# Patient Record
Sex: Male | Born: 1950
Health system: Southern US, Community
[De-identification: ages and names within clinical notes are randomized; demographics above are authoritative.]

## PROBLEM LIST (undated history)

## (undated) DIAGNOSIS — E785 Hyperlipidemia, unspecified: Secondary | ICD-10-CM

## (undated) DIAGNOSIS — Z720 Tobacco use: Secondary | ICD-10-CM

## (undated) DIAGNOSIS — R55 Syncope and collapse: Secondary | ICD-10-CM

## (undated) DIAGNOSIS — I340 Nonrheumatic mitral (valve) insufficiency: Secondary | ICD-10-CM

## (undated) DIAGNOSIS — E119 Type 2 diabetes mellitus without complications: Secondary | ICD-10-CM

## (undated) HISTORY — PX: URETERAL STENT PLACEMENT: SHX822

## (undated) HISTORY — DX: Nonrheumatic mitral (valve) insufficiency: I34.0

## (undated) HISTORY — DX: Syncope and collapse: R55

---

## 2017-02-27 ENCOUNTER — Telehealth: Payer: Self-pay | Admitting: Podiatry

## 2017-02-27 NOTE — Telephone Encounter (Signed)
Pt called wanting to speak to you about appointment made for next Wednesday.

## 2017-03-06 ENCOUNTER — Ambulatory Visit (INDEPENDENT_AMBULATORY_CARE_PROVIDER_SITE_OTHER): Payer: Managed Care, Other (non HMO) | Admitting: Podiatry

## 2017-03-06 ENCOUNTER — Encounter: Payer: Self-pay | Admitting: Podiatry

## 2017-03-06 ENCOUNTER — Ambulatory Visit (INDEPENDENT_AMBULATORY_CARE_PROVIDER_SITE_OTHER): Payer: Managed Care, Other (non HMO)

## 2017-03-06 VITALS — BP 141/91 | HR 75 | Temp 97.6°F | Resp 18

## 2017-03-06 DIAGNOSIS — M79673 Pain in unspecified foot: Secondary | ICD-10-CM | POA: Diagnosis not present

## 2017-03-06 DIAGNOSIS — S92011A Displaced fracture of body of right calcaneus, initial encounter for closed fracture: Secondary | ICD-10-CM | POA: Diagnosis not present

## 2017-03-06 NOTE — Progress Notes (Signed)
   Subjective:    Patient ID: Joseph Robles, male    DOB: Feb 24, 1951, 66 y.o.   MRN: [id number]  HPI: He presents today with a chief complaint of   fracture to his right heel. He states that June 24 he was on a ladder that was falling and he jumped from the ladder landing on his heel. States that he walked around for 2 or 3 days with no pain but noticed a lot of swelling. He was seen in the emergency department was told that he had a fractured heel and a CT scan was performed. CT scan does relate to fracture lines including the sustentaculum tali and middle facet of the subtalar joint and the posterior facet as well. He states that he's been using crutches as well as a splint that they placed him in at that time. He has not been weightbearing. He has taken some of the Ace bandage off from time to time. He states his diabetes is under control states that his hemoglobin A1c is 7.2 denies neuropathy in any extremity but states that he has no pain with this foot whatsoever.   Review of Systems     Objective:   Physical Exam: Vital signs are stable he is alert and oriented 3. Pulses are palpable bilateral. Diminished sensorium to the right foot. Left foot normal sensorium present per Semmes-Weinstein monofilament deep tendon reflexes are not elicitable. Muscle strength relatively normal bilateral. Orthopedic evaluation demonstrates edematous right lower extremity no open lesions or wounds are noted. No pain on mediolateral compression of the calcaneus. Radiographs taken today do demonstrate osseously mature individual with a large fracture of the posterior inferior aspect of the calcaneus. The CT report does demonstrate 2 or 3 fracture lines. I would actually like to see the CT myself. We are requesting the disc.       Assessment & Plan:  Fractural calcaneus displaced 6 weeks right foot.  Plan: At this point I demonstrated to him and his wife how to compress the fluid from the right lower extremity  with 3 Ace bandages. The understand this and amenable to it will do this. Also place him in a Cam Walker nonweightbearing fashion. He is to follow up with us in a couple weeks which time we hope to have the CT scans back. We will discuss the need for surgical intervention if necessary.

## 2017-03-20 ENCOUNTER — Ambulatory Visit (INDEPENDENT_AMBULATORY_CARE_PROVIDER_SITE_OTHER): Payer: Managed Care, Other (non HMO) | Admitting: Sports Medicine

## 2017-03-20 DIAGNOSIS — M79672 Pain in left foot: Secondary | ICD-10-CM

## 2017-03-20 DIAGNOSIS — S92011D Displaced fracture of body of right calcaneus, subsequent encounter for fracture with routine healing: Secondary | ICD-10-CM

## 2017-03-20 DIAGNOSIS — R600 Localized edema: Secondary | ICD-10-CM

## 2017-03-20 DIAGNOSIS — E119 Type 2 diabetes mellitus without complications: Secondary | ICD-10-CM | POA: Diagnosis not present

## 2017-03-20 DIAGNOSIS — M79671 Pain in right foot: Secondary | ICD-10-CM

## 2017-03-20 NOTE — Progress Notes (Signed)
Subjective: Joseph Robles is a 66 y.o. male patient who presents to office for evaluation of Right heel fracture. Patient admits to injury on June 24 thought he sprained it badly after falling from ladder; finally had CT done on 02-21-17 which confirmed fracture and since has been non-weightbearing and was referred from [facility]. He saw Dr. Al CorpusHyatt here at [facility], 2 weeks ago and was given CAM boot and instructions on wrapping with ACE to reduce edema. Patient denies any other pedal complaints.   There are no active problems to display for this patient.   Current Outpatient Prescriptions on File Prior to Visit  Medication Sig Dispense Refill  . metFORMIN (GLUCOPHAGE) 500 MG tablet      No current facility-administered medications on file prior to visit.     Allergies  Allergen Reactions  . Neosporin [Neomycin-Bacitracin Zn-Polymyx] Rash    Objective:  General: Alert and oriented x3 in no acute distress  Dermatology: + petichae and likely irritation from ACE too tight on right, No open lesions bilateral lower extremities, no webspace macerations, no ecchymosis bilateral, all nails x 10 are well manicured.  Vascular: Focal edema noted to right foot and lower leg, Dorsalis Pedis and Posterior Tibial pedal pulses 2/4, Capillary Fill Time 3 seconds,(+) pedal hair growth bilateral, Temperature gradient within normal limits.  Neurology: Gross sensation intact via light touch bilateral, tuning fork diminished on right however protective intact.  Musculoskeletal: There is no tenderness with palpation at heel on Right foot, There is not pain present with tuning fork to right heel,No pain with calf compression bilateral. All joint range of motion is within normal limits except at area of concern/fracture on right heel, Strength within normal limits in all groups bilateral.   CT scan 02-21-17 communited fracture right heel at calcanueus with attenuation AFT ligament. No  tendon entrapment.     Assessment and Plan: Problem List Items Addressed This Visit    None    Visit Diagnoses    Closed displaced fracture of body of right calcaneus with routine healing, subsequent encounter    -  Primary   Edema of right foot       Right foot pain       Diabetes mellitus without complication (HCC)           -Complete examination performed -CT scans reviewed  -Discussed treatement options for fracture; risks, alternatives, and benefits explained. The fracture is almost 2 months old since date of injury and at this point the fracture has already started to heal by concept of natural biologics; No surgery at this time will continue with conservative care -Applied surgitube compression sleeve to wear on right to assist with edema reduction  -Continue with crutches and non weight bearing with CAM boot protection to wear at all times when up and moving around with crutches  -Recommend protection, rest, ice, elevation daily until symptoms improve -Recommend vit d, calcium and anti-inflammatories prn to help bone healing and with swelling  -Short term disability paperwork completed hon patient 's behalf with estimated time off work to all fracture to heal of 3 months (06-20-17) -Patient to return to office in 3 weeks for serial x-rays of right heel to assess healing or sooner if condition worsens.  Asencion Islamitorya Litsy Epting, DPM

## 2017-04-10 ENCOUNTER — Encounter (INDEPENDENT_AMBULATORY_CARE_PROVIDER_SITE_OTHER): Payer: Self-pay

## 2017-04-10 ENCOUNTER — Ambulatory Visit (INDEPENDENT_AMBULATORY_CARE_PROVIDER_SITE_OTHER): Payer: 59 | Admitting: Sports Medicine

## 2017-04-10 ENCOUNTER — Ambulatory Visit (INDEPENDENT_AMBULATORY_CARE_PROVIDER_SITE_OTHER): Payer: 59

## 2017-04-10 DIAGNOSIS — M79671 Pain in right foot: Secondary | ICD-10-CM

## 2017-04-10 DIAGNOSIS — S92011D Displaced fracture of body of right calcaneus, subsequent encounter for fracture with routine healing: Secondary | ICD-10-CM

## 2017-04-10 DIAGNOSIS — E119 Type 2 diabetes mellitus without complications: Secondary | ICD-10-CM

## 2017-04-10 DIAGNOSIS — R600 Localized edema: Secondary | ICD-10-CM

## 2017-04-10 NOTE — Progress Notes (Signed)
Subjective: Joseph Robles is a 66 y.o. male patient who presents to office for evaluation of Right heel fracture. Patient had injury on June 24 and has been NWB with crutches and CAM boot and wrapping with ACE to reduce edema. Patient denies pain. Denies any other pedal complaints.   There are no active problems to display for this patient.   Current Outpatient Prescriptions on File Prior to Visit  Medication Sig Dispense Refill  . metFORMIN (GLUCOPHAGE) 500 MG tablet      No current facility-administered medications on file prior to visit.     Allergies  Allergen Reactions  . Neosporin [Neomycin-Bacitracin Zn-Polymyx] Rash    Objective:  General: Alert and oriented x3 in no acute distress  Dermatology:  No open lesions bilateral lower extremities, no webspace macerations, no ecchymosis bilateral, all nails x 10 are well manicured.  Vascular: Focal edema noted to right foot and lower leg, Dorsalis Pedis and Posterior Tibial pedal pulses 2/4, Capillary Fill Time 3 seconds,(+) pedal hair growth bilateral, Temperature gradient within normal limits.  Neurology: Gross sensation intact via light touch bilateral, tuning fork diminished on right however protective intact.  Musculoskeletal: There is no tenderness with palpation at heel on Right foot, There is not pain present with tuning fork to right heel,No pain with calf compression bilateral. All joint range of motion is within normal limits except at area of concern/fracture on right heel, Strength within normal limits in all groups bilateral.   Xray right foot- healing calcaneal fracture about 85% improved with still evidence of 1mm fracture at inferior fracture line, with acceptable alignment and retained height. There is posterior heel spur and mild midfoot arthritis. Mild soft tissue swelling. No other acute findings.   Assessment and Plan: Problem List Items Addressed This Visit    None    Visit Diagnoses    Closed displaced  fracture of body of right calcaneus with routine healing, subsequent encounter    -  Primary   Relevant Orders   DG Foot Complete Right (Completed)   Edema of right foot       Right foot pain       Diabetes mellitus without complication (HCC)           -Complete examination performed -Xrays reviewed  -Discussed continue care for calcaneal fracture -Continue with surgitube compression sleeve to wear on right to assist with edema reduction  -Continue with crutches and non weight bearing with CAM boot protection to wear at all times when up and moving around with crutches  -Recommend protection, rest, ice, elevation daily until symptoms improve -Recommend continue with vit d, calcium and anti-inflammatories prn to help bone healing and with swelling  -Continue with Short term disability estimated return 06-20-17 -At next visit if continuing to do well will progress patient to weightbearing with the CAM Boot. Advised patient that likely he will have to do home PT.  -Patient to return to office in 4 weeks for serial x-rays of right heel to assess healing or sooner if condition worsens.  Asencion Islamitorya Kirk Basquez, DPM

## 2017-04-17 ENCOUNTER — Telehealth: Payer: Self-pay | Admitting: Sports Medicine

## 2017-04-17 NOTE — Telephone Encounter (Signed)
Pt's wife called wanting to know if Dr. Marylene LandStover can put on the claims form and/or letter head that pt also has/had a dislocated ankle as well as a fractured ankle. Insurance will pay us $400 extra if it states his ankle was also dislocated. I would prefer to speak to Dr. Marylene LandStover directly in regards to this matter. She can call me back at [phone].

## 2017-05-08 ENCOUNTER — Ambulatory Visit (INDEPENDENT_AMBULATORY_CARE_PROVIDER_SITE_OTHER): Payer: BLUE CROSS/BLUE SHIELD | Admitting: Sports Medicine

## 2017-05-08 ENCOUNTER — Ambulatory Visit (INDEPENDENT_AMBULATORY_CARE_PROVIDER_SITE_OTHER): Payer: BLUE CROSS/BLUE SHIELD

## 2017-05-08 DIAGNOSIS — S92011D Displaced fracture of body of right calcaneus, subsequent encounter for fracture with routine healing: Secondary | ICD-10-CM

## 2017-05-08 DIAGNOSIS — B359 Dermatophytosis, unspecified: Secondary | ICD-10-CM | POA: Diagnosis not present

## 2017-05-08 DIAGNOSIS — E119 Type 2 diabetes mellitus without complications: Secondary | ICD-10-CM | POA: Diagnosis not present

## 2017-05-08 DIAGNOSIS — M79671 Pain in right foot: Secondary | ICD-10-CM

## 2017-05-08 DIAGNOSIS — R600 Localized edema: Secondary | ICD-10-CM | POA: Diagnosis not present

## 2017-05-08 MED ORDER — CLOTRIMAZOLE 1 % EX SOLN: 1.0000 "application " | Freq: Two times a day (BID) | CUTANEOUS | 5 refills | Status: DC

## 2017-05-08 MED ORDER — MICONAZOLE NITRATE 2 % EX AERO: INHALATION_SPRAY | CUTANEOUS | 1 refills | Status: DC

## 2017-05-08 NOTE — Progress Notes (Signed)
Subjective: Joseph Robles is a 66 y.o. male patient who presents to office for evaluation of Right heel fracture. Patient had injury on June 24 and has been NWB with crutches and CAM boot and wrapping with ACE to reduce edema. Patient denies pain. Denies any other pedal complaints.   There are no active problems to display for this patient.   Current Outpatient Prescriptions on File Prior to Visit  Medication Sig Dispense Refill  . metFORMIN (GLUCOPHAGE) 500 MG tablet      No current facility-administered medications on file prior to visit.     Allergies  Allergen Reactions  . Neosporin [Neomycin-Bacitracin Zn-Polymyx] Rash    Objective:  General: Alert and oriented x3 in no acute distress  Dermatology:  No open lesions bilateral lower extremities, no webspace macerations, no ecchymosis bilateral, all nails x 10 are well manicured. Scaly interdigital patches consistent with tinea at heel and in between toes.   Vascular: Focal edema noted to right foot and lower leg, Dorsalis Pedis and Posterior Tibial pedal pulses 2/4, Capillary Fill Time 3 seconds,(+) pedal hair growth bilateral, Temperature gradient within normal limits.  Neurology: Gross sensation intact via light touch bilateral, tuning fork diminished on right however protective intact.  Musculoskeletal: There is no tenderness with palpation at heel on Right foot, There is not pain present with tuning fork to right heel,No pain with calf compression bilateral. All joint range of motion is within normal limits except at area of concern/fracture on right heel, Strength within normal limits in all groups bilateral.   Xray right foot- healing calcaneal fracture about 85% improved with still evidence of 1mm fracture at inferior fracture line, with acceptable alignment and retained height, as prior. There is posterior heel spur and mild midfoot arthritis. Mild soft tissue swelling. No other acute findings.   Assessment and Plan: Problem  List Items Addressed This Visit    None    Visit Diagnoses    Closed displaced fracture of body of right calcaneus with routine healing, subsequent encounter    -  Primary   Relevant Orders   DG Foot Complete Right   Edema of right foot       Right foot pain       Diabetes mellitus without complication (HCC)       Tinea       Relevant Medications   clotrimazole (LOTRIMIN) 1 % external solution   Miconazole Nitrate 2 % AERO      -Complete examination performed -Prescribed clotrimazole solution and miconazole spray for tinea, right foot -Xrays reviewed  -Discussed continue care for calcaneal fracture -Continue with surgitube compression sleeve to wear on right to assist with edema reduction  -Continue with crutches and Now partial weight bearing to forefoot with CAM boot protection to wear at all times when up and moving around with crutches  -Recommend protection, rest, ice, elevation daily until symptoms improve -Recommend continue with vit d, calcium and anti-inflammatories prn to help bone healing and with swelling  -Continue with Short term disability estimated return 06-20-17 however, this return to work date will be reassessed at next visit -At next visit if continuing to do well will progress patient to weightbearing with the CAM Boot. Advised patient that likely he will have to do home PT. may now start with home exercises that are nonweightbearing with range of motion to the foot and ankle. -Patient to return to office in 4-5 weeks for serial x-rays of right heel to assess healing or sooner if  condition worsens.  Landis Martins, DPM

## 2017-06-12 ENCOUNTER — Encounter: Payer: Self-pay | Admitting: Sports Medicine

## 2017-06-12 ENCOUNTER — Ambulatory Visit: Payer: BLUE CROSS/BLUE SHIELD | Admitting: Sports Medicine

## 2017-06-12 ENCOUNTER — Ambulatory Visit (INDEPENDENT_AMBULATORY_CARE_PROVIDER_SITE_OTHER): Payer: BLUE CROSS/BLUE SHIELD

## 2017-06-12 DIAGNOSIS — B359 Dermatophytosis, unspecified: Secondary | ICD-10-CM | POA: Diagnosis not present

## 2017-06-12 DIAGNOSIS — S92011D Displaced fracture of body of right calcaneus, subsequent encounter for fracture with routine healing: Secondary | ICD-10-CM

## 2017-06-12 DIAGNOSIS — E119 Type 2 diabetes mellitus without complications: Secondary | ICD-10-CM | POA: Diagnosis not present

## 2017-06-12 DIAGNOSIS — M79671 Pain in right foot: Secondary | ICD-10-CM

## 2017-06-12 DIAGNOSIS — R600 Localized edema: Secondary | ICD-10-CM

## 2017-06-12 NOTE — Progress Notes (Addendum)
Subjective: Joseph Robles is a 66 y.o. male patient who presents to office for evaluation of Right heel fracture. Patient had injury on June 24 and has been NWB with crutches and CAM boot and wrapping with ACE to reduce edema. Patient denies pain, states that its slowly getting better. Denies any other pedal complaints.   There are no active problems to display for this patient.   Current Outpatient Medications on File Prior to Visit  Medication Sig Dispense Refill  . clotrimazole (LOTRIMIN) 1 % external solution Apply 1 application topically 2 (two) times daily. In between toes 60 mL 5  . metFORMIN (GLUCOPHAGE) 500 MG tablet     . Miconazole Nitrate 2 % AERO To foot for tinea daily 150 Bottle 1   No current facility-administered medications on file prior to visit.     Allergies  Allergen Reactions  . Neosporin [Neomycin-Bacitracin Zn-Polymyx] Rash    Objective:  General: Alert and oriented x3 in no acute distress  Dermatology:  No open lesions bilateral lower extremities, no webspace macerations, no ecchymosis bilateral, all nails x 10 are well manicured. Scaly interdigital patches consistent with tinea at heel and in between toes.   Vascular: Focal edema noted to right foot and lower leg, Dorsalis Pedis and Posterior Tibial pedal pulses 2/4, Capillary Fill Time 3 seconds,(+) pedal hair growth bilateral, Temperature gradient within normal limits.  Neurology: Gross sensation intact via light touch bilateral, tuning fork diminished on right however protective intact.  Musculoskeletal: There is no tenderness with palpation at heel on Right foot, There is not pain present with tuning fork to right heel,No pain with calf compression bilateral. All joint range of motion is within normal limits except at area of concern/fracture on right heel, Strength within normal limits in all groups bilateral.   Xray right foot- healing calcaneal fracture about 85% improved with still evidence of 1mm  fracture at inferior fracture line, with acceptable alignment and retained height, as prior. There is posterior heel spur and mild midfoot arthritis. Mild soft tissue swelling. No other acute findings.   Assessment and Plan: Problem List Items Addressed This Visit    None    Visit Diagnoses    Closed displaced fracture of body of right calcaneus with routine healing, subsequent encounter    -  Primary   Relevant Orders   DG Foot Complete Right   Edema of right foot       Right foot pain       Diabetes mellitus without complication (HCC)       Tinea          -Complete examination performed -Xrays reviewed  -Discussed continue care for prematurely healed calcaneal fracture -Continue with surgitube compression sleeve to wear on right to assist with edema reduction  -May start to slowly wean from crutches and fully weight-bear with good supportive shoes  -Recommend protection, rest, ice, elevation daily until symptoms improve -Recommend continue with vit d, calcium and anti-inflammatories prn to help continue with bone healing and with swelling  -Continue with Short term disability estimated return 08-19-17 full duty no restrictions; Paperwork completed in office   -At next visit if continuing to do well will progress to normal activities. May now start with home exercises that are nonweightbearing with range of motion to the foot and ankle. -Continue with antifungal treatments for tinea, right foot -Patient to return to office in 4-5 weeks for serial x-rays of right heel to assess healing or sooner if condition worsens.  Landis Martins, DPM

## 2017-07-11 ENCOUNTER — Ambulatory Visit: Payer: BLUE CROSS/BLUE SHIELD | Admitting: Sports Medicine

## 2017-07-11 ENCOUNTER — Ambulatory Visit: Payer: BLUE CROSS/BLUE SHIELD

## 2017-07-11 ENCOUNTER — Encounter: Payer: Self-pay | Admitting: Sports Medicine

## 2017-07-11 DIAGNOSIS — S92011D Displaced fracture of body of right calcaneus, subsequent encounter for fracture with routine healing: Secondary | ICD-10-CM

## 2017-07-11 DIAGNOSIS — E119 Type 2 diabetes mellitus without complications: Secondary | ICD-10-CM | POA: Diagnosis not present

## 2017-07-11 DIAGNOSIS — M79671 Pain in right foot: Secondary | ICD-10-CM

## 2017-07-11 DIAGNOSIS — R600 Localized edema: Secondary | ICD-10-CM

## 2017-07-11 NOTE — Progress Notes (Signed)
Subjective: Joseph Robles is a 66 y.o. male patient who presents to office for evaluation of Right heel fracture. Patient had injury on June 24 and has slowly transition back to good supportive shoes/hiking boot and wrapping with ACE to reduce edema. Patient admits after being on his foot for several hours throughout the day has swelling and has a limp. Denies any other pedal complaints.   There are no active problems to display for this patient.   Current Outpatient Medications on File Prior to Visit  Medication Sig Dispense Refill  . clotrimazole (LOTRIMIN) 1 % external solution Apply 1 application topically 2 (two) times daily. In between toes 60 mL 5  . metFORMIN (GLUCOPHAGE) 500 MG tablet     . Miconazole Nitrate 2 % AERO To foot for tinea daily 150 Bottle 1   No current facility-administered medications on file prior to visit.     Allergies  Allergen Reactions  . Neosporin [Neomycin-Bacitracin Zn-Polymyx] Rash    Objective:  General: Alert and oriented x3 in no acute distress  Dermatology:  No open lesions bilateral lower extremities, no webspace macerations, no ecchymosis bilateral, all nails x 10 are well manicured. Scaly interdigital patches consistent with tinea at heel and in between toes per patient that is improving with home remedy.   Vascular: Focal edema noted to right foot and lower leg, Dorsalis Pedis and Posterior Tibial pedal pulses 2/4, Capillary Fill Time 3 seconds,(+) pedal hair growth bilateral, Temperature gradient within normal limits.  Neurology: Gross sensation intact via light touch bilateral, tuning fork diminished on right however protective intact.  Musculoskeletal: There is no tenderness with palpation at heel on Right foot, No pain with calf compression bilateral. All joint range of motion is within normal limits except at area of concern/fracture on right heel when there is mild guarding, Strength within normal limits in all groups bilateral.   Patient  refused repeat x-ray at this visit.  Assessment and Plan: Problem List Items Addressed This Visit    None    Visit Diagnoses    Closed displaced fracture of body of right calcaneus with routine healing, subsequent encounter    -  Primary   Edema of right foot       Right foot pain       Diabetes mellitus without complication (HCC)          -Complete examination performed -Patient refused repeat x-ray due to financial charges -Discussed continue care for previously prematurely healed calcaneal fracture -Recommend patient to go to elastic therapy for compression garments to assist with edema control -May  may continue to weight-bear with good supportive shoes  -Recommend to pace self with activities and rest as needed, ice, elevation daily until symptoms improve -Recommend continue with vit d, calcium and anti-inflammatories prn to help continue with bone healing and with swelling  -Continue with Short term disability estimated return 08-19-17 full duty no restrictions; will reassess return to work at next visit -Continue with antifungal treatments/home remedies for tinea, right foot -Patient to return to office in 4-5 weeks for serial x-rays of right heel to assess healing or sooner if condition worsens.  Asencion Islamitorya Sharyn Brilliant, DPM

## 2017-08-14 ENCOUNTER — Ambulatory Visit (INDEPENDENT_AMBULATORY_CARE_PROVIDER_SITE_OTHER): Payer: BLUE CROSS/BLUE SHIELD

## 2017-08-14 ENCOUNTER — Ambulatory Visit: Payer: BLUE CROSS/BLUE SHIELD | Admitting: Sports Medicine

## 2017-08-14 ENCOUNTER — Encounter: Payer: Self-pay | Admitting: Sports Medicine

## 2017-08-14 DIAGNOSIS — M79671 Pain in right foot: Secondary | ICD-10-CM

## 2017-08-14 DIAGNOSIS — R600 Localized edema: Secondary | ICD-10-CM | POA: Diagnosis not present

## 2017-08-14 DIAGNOSIS — B359 Dermatophytosis, unspecified: Secondary | ICD-10-CM

## 2017-08-14 DIAGNOSIS — S92011D Displaced fracture of body of right calcaneus, subsequent encounter for fracture with routine healing: Secondary | ICD-10-CM

## 2017-08-14 DIAGNOSIS — E119 Type 2 diabetes mellitus without complications: Secondary | ICD-10-CM | POA: Diagnosis not present

## 2017-08-14 NOTE — Progress Notes (Signed)
Subjective: Joseph Robles is a 67 y.o. male patient who presents to office for evaluation of Right heel fracture. Patient had injury on June 24 and has slowly transition back to good supportive shoes/hiking boot and wrapping with ACE to reduce edema PRN. Patient admits discomfort and stiffness that sometimes leads to pain after a few hours. Can not do much before he has pain. Marland Kitchen. Has difficulty even with simple tasks like going to the grocery store.  Denies any other pedal complaints.   There are no active problems to display for this patient.   Current Outpatient Medications on File Prior to Visit  Medication Sig Dispense Refill  . clotrimazole (LOTRIMIN) 1 % external solution Apply 1 application topically 2 (two) times daily. In between toes 60 mL 5  . metFORMIN (GLUCOPHAGE) 500 MG tablet     . Miconazole Nitrate 2 % AERO To foot for tinea daily 150 Bottle 1   No current facility-administered medications on file prior to visit.     Allergies  Allergen Reactions  . Neosporin [Neomycin-Bacitracin Zn-Polymyx] Rash    Objective:  General: Alert and oriented x3 in no acute distress  Dermatology:  No open lesions bilateral lower extremities, no webspace macerations, no ecchymosis bilateral, all nails x 10 are well manicured. Scaly interdigital patches consistent with tinea at heel and in between toes per patient that is improving with home remedy.   Vascular: Focal edema noted to right foot and lower leg, Dorsalis Pedis and Posterior Tibial pedal pulses 2/4, Capillary Fill Time 3 seconds,(+) pedal hair growth bilateral, Temperature gradient within normal limits.  Neurology: Gross sensation intact via light touch bilateral, tuning fork diminished on right however protective intact.  Musculoskeletal: There is no tenderness with palpation at heel on Right foot, No pain with calf compression bilateral. All joint range of motion is within normal limits except at area of concern/fracture on right heel  when there is mild guarding, Strength within normal limits in all groups bilateral.   Assessment and Plan: Problem List Items Addressed This Visit    None    Visit Diagnoses    Closed displaced fracture of body of right calcaneus with routine healing, subsequent encounter    -  Primary   Relevant Orders   DG Foot Complete Right   Edema of right foot       Right foot pain       Diabetes mellitus without complication (HCC)       Tinea          -Complete examination performed -Xrays reveal healed calcaneal fracture  -Discussed continue care for healed calcaneal fracture -Recommend continue with compression for edema control -May continue to weight-bear with good supportive shoes and may start increasing physical therapy personal exercises -Recommend to pace self with activities and rest as needed, ice, elevation daily until symptoms improve -Recommend continue with vit d, calcium and anti-inflammatories prn to help continue with bone healing and with swelling; Recommend tumeric for inflammation. -Continue with Short term disability estimated return 09-19-17 full duty no restrictions; will reassess return to work at next visit and advised patient that I cannot fill out long-term disability paperwork this has to be done by a MD or PCP. -Continue with antifungal treatments/home remedies for tinea, right foot as needed -Patient to return to office in 8 weeks for serial x-rays of right heel to assess healing or sooner if condition worsens.  Asencion Islamitorya Fynn Vanblarcom, DPM

## 2017-10-11 ENCOUNTER — Ambulatory Visit: Payer: BLUE CROSS/BLUE SHIELD | Admitting: Sports Medicine

## 2017-10-15 ENCOUNTER — Other Ambulatory Visit: Payer: Self-pay | Admitting: Sports Medicine

## 2017-10-15 DIAGNOSIS — S92011D Displaced fracture of body of right calcaneus, subsequent encounter for fracture with routine healing: Secondary | ICD-10-CM

## 2017-10-15 DIAGNOSIS — R600 Localized edema: Secondary | ICD-10-CM

## 2017-10-15 DIAGNOSIS — M79671 Pain in right foot: Secondary | ICD-10-CM

## 2018-02-05 DIAGNOSIS — E785 Hyperlipidemia, unspecified: Secondary | ICD-10-CM | POA: Diagnosis not present

## 2018-02-05 DIAGNOSIS — Z720 Tobacco use: Secondary | ICD-10-CM | POA: Diagnosis not present

## 2018-02-05 DIAGNOSIS — E1165 Type 2 diabetes mellitus with hyperglycemia: Secondary | ICD-10-CM | POA: Diagnosis not present

## 2018-02-05 DIAGNOSIS — Z139 Encounter for screening, unspecified: Secondary | ICD-10-CM | POA: Diagnosis not present

## 2018-02-05 DIAGNOSIS — Z1331 Encounter for screening for depression: Secondary | ICD-10-CM | POA: Diagnosis not present

## 2018-02-05 DIAGNOSIS — E1169 Type 2 diabetes mellitus with other specified complication: Secondary | ICD-10-CM | POA: Diagnosis not present

## 2018-05-22 DIAGNOSIS — H2513 Age-related nuclear cataract, bilateral: Secondary | ICD-10-CM | POA: Diagnosis not present

## 2018-06-30 DIAGNOSIS — E785 Hyperlipidemia, unspecified: Secondary | ICD-10-CM | POA: Diagnosis not present

## 2018-06-30 DIAGNOSIS — Z6823 Body mass index (BMI) 23.0-23.9, adult: Secondary | ICD-10-CM | POA: Diagnosis not present

## 2018-06-30 DIAGNOSIS — E1169 Type 2 diabetes mellitus with other specified complication: Secondary | ICD-10-CM | POA: Diagnosis not present

## 2018-06-30 DIAGNOSIS — E1165 Type 2 diabetes mellitus with hyperglycemia: Secondary | ICD-10-CM | POA: Diagnosis not present

## 2019-01-16 DIAGNOSIS — Z6823 Body mass index (BMI) 23.0-23.9, adult: Secondary | ICD-10-CM | POA: Diagnosis not present

## 2019-01-16 DIAGNOSIS — E1165 Type 2 diabetes mellitus with hyperglycemia: Secondary | ICD-10-CM | POA: Diagnosis not present

## 2019-01-16 DIAGNOSIS — L819 Disorder of pigmentation, unspecified: Secondary | ICD-10-CM | POA: Diagnosis not present

## 2019-01-16 DIAGNOSIS — R238 Other skin changes: Secondary | ICD-10-CM | POA: Diagnosis not present

## 2019-01-21 DIAGNOSIS — E1165 Type 2 diabetes mellitus with hyperglycemia: Secondary | ICD-10-CM | POA: Diagnosis not present

## 2019-01-21 DIAGNOSIS — E785 Hyperlipidemia, unspecified: Secondary | ICD-10-CM | POA: Diagnosis not present

## 2019-01-21 DIAGNOSIS — L819 Disorder of pigmentation, unspecified: Secondary | ICD-10-CM | POA: Diagnosis not present

## 2019-01-21 DIAGNOSIS — E1169 Type 2 diabetes mellitus with other specified complication: Secondary | ICD-10-CM | POA: Diagnosis not present

## 2019-02-04 DIAGNOSIS — E785 Hyperlipidemia, unspecified: Secondary | ICD-10-CM | POA: Diagnosis not present

## 2019-02-04 DIAGNOSIS — E1165 Type 2 diabetes mellitus with hyperglycemia: Secondary | ICD-10-CM | POA: Diagnosis not present

## 2019-02-04 DIAGNOSIS — E1169 Type 2 diabetes mellitus with other specified complication: Secondary | ICD-10-CM | POA: Diagnosis not present

## 2019-05-01 DIAGNOSIS — E1169 Type 2 diabetes mellitus with other specified complication: Secondary | ICD-10-CM | POA: Diagnosis not present

## 2019-05-01 DIAGNOSIS — E1165 Type 2 diabetes mellitus with hyperglycemia: Secondary | ICD-10-CM | POA: Diagnosis not present

## 2019-08-11 DIAGNOSIS — R300 Dysuria: Secondary | ICD-10-CM | POA: Diagnosis not present

## 2019-08-17 DIAGNOSIS — N390 Urinary tract infection, site not specified: Secondary | ICD-10-CM | POA: Diagnosis not present

## 2019-08-17 DIAGNOSIS — E1165 Type 2 diabetes mellitus with hyperglycemia: Secondary | ICD-10-CM | POA: Diagnosis not present

## 2019-08-17 DIAGNOSIS — M549 Dorsalgia, unspecified: Secondary | ICD-10-CM | POA: Diagnosis not present

## 2019-08-17 DIAGNOSIS — E1169 Type 2 diabetes mellitus with other specified complication: Secondary | ICD-10-CM | POA: Diagnosis not present

## 2019-08-17 DIAGNOSIS — E785 Hyperlipidemia, unspecified: Secondary | ICD-10-CM | POA: Diagnosis not present

## 2019-08-31 DIAGNOSIS — E785 Hyperlipidemia, unspecified: Secondary | ICD-10-CM | POA: Diagnosis not present

## 2019-08-31 DIAGNOSIS — Z6820 Body mass index (BMI) 20.0-20.9, adult: Secondary | ICD-10-CM | POA: Diagnosis not present

## 2019-08-31 DIAGNOSIS — E1169 Type 2 diabetes mellitus with other specified complication: Secondary | ICD-10-CM | POA: Diagnosis not present

## 2019-08-31 DIAGNOSIS — E1165 Type 2 diabetes mellitus with hyperglycemia: Secondary | ICD-10-CM | POA: Diagnosis not present

## 2019-10-06 DIAGNOSIS — N401 Enlarged prostate with lower urinary tract symptoms: Secondary | ICD-10-CM | POA: Diagnosis not present

## 2019-10-06 DIAGNOSIS — Z1152 Encounter for screening for COVID-19: Secondary | ICD-10-CM | POA: Diagnosis not present

## 2019-10-06 DIAGNOSIS — N3090 Cystitis, unspecified without hematuria: Secondary | ICD-10-CM | POA: Diagnosis not present

## 2019-10-06 DIAGNOSIS — N318 Other neuromuscular dysfunction of bladder: Secondary | ICD-10-CM | POA: Diagnosis not present

## 2019-10-06 DIAGNOSIS — N201 Calculus of ureter: Secondary | ICD-10-CM | POA: Diagnosis not present

## 2019-10-06 DIAGNOSIS — N3020 Other chronic cystitis without hematuria: Secondary | ICD-10-CM | POA: Diagnosis not present

## 2019-10-12 DIAGNOSIS — F17210 Nicotine dependence, cigarettes, uncomplicated: Secondary | ICD-10-CM | POA: Diagnosis not present

## 2019-10-12 DIAGNOSIS — E119 Type 2 diabetes mellitus without complications: Secondary | ICD-10-CM | POA: Diagnosis not present

## 2019-10-12 DIAGNOSIS — Z79899 Other long term (current) drug therapy: Secondary | ICD-10-CM | POA: Diagnosis not present

## 2019-10-12 DIAGNOSIS — Z7984 Long term (current) use of oral hypoglycemic drugs: Secondary | ICD-10-CM | POA: Diagnosis not present

## 2019-10-12 DIAGNOSIS — E785 Hyperlipidemia, unspecified: Secondary | ICD-10-CM | POA: Diagnosis not present

## 2019-10-12 DIAGNOSIS — N211 Calculus in urethra: Secondary | ICD-10-CM | POA: Diagnosis not present

## 2019-10-12 DIAGNOSIS — Z792 Long term (current) use of antibiotics: Secondary | ICD-10-CM | POA: Diagnosis not present

## 2019-10-13 DIAGNOSIS — E119 Type 2 diabetes mellitus without complications: Secondary | ICD-10-CM | POA: Diagnosis not present

## 2019-10-13 DIAGNOSIS — Z79899 Other long term (current) drug therapy: Secondary | ICD-10-CM | POA: Diagnosis not present

## 2019-10-13 DIAGNOSIS — E785 Hyperlipidemia, unspecified: Secondary | ICD-10-CM | POA: Diagnosis not present

## 2019-10-13 DIAGNOSIS — Z792 Long term (current) use of antibiotics: Secondary | ICD-10-CM | POA: Diagnosis not present

## 2019-10-13 DIAGNOSIS — F17210 Nicotine dependence, cigarettes, uncomplicated: Secondary | ICD-10-CM | POA: Diagnosis not present

## 2019-10-13 DIAGNOSIS — N211 Calculus in urethra: Secondary | ICD-10-CM | POA: Diagnosis not present

## 2019-10-16 DIAGNOSIS — N201 Calculus of ureter: Secondary | ICD-10-CM | POA: Diagnosis not present

## 2019-10-16 DIAGNOSIS — N3090 Cystitis, unspecified without hematuria: Secondary | ICD-10-CM | POA: Diagnosis not present

## 2019-10-16 DIAGNOSIS — N3020 Other chronic cystitis without hematuria: Secondary | ICD-10-CM | POA: Diagnosis not present

## 2019-10-20 DIAGNOSIS — R82998 Other abnormal findings in urine: Secondary | ICD-10-CM | POA: Diagnosis not present

## 2019-10-20 DIAGNOSIS — R399 Unspecified symptoms and signs involving the genitourinary system: Secondary | ICD-10-CM

## 2019-10-20 DIAGNOSIS — N211 Calculus in urethra: Secondary | ICD-10-CM | POA: Diagnosis not present

## 2019-10-20 HISTORY — DX: Unspecified symptoms and signs involving the genitourinary system: R39.9

## 2019-10-26 DIAGNOSIS — N211 Calculus in urethra: Secondary | ICD-10-CM | POA: Diagnosis not present

## 2019-10-26 DIAGNOSIS — N35016 Post-traumatic urethral stricture, male, overlapping sites: Secondary | ICD-10-CM | POA: Diagnosis not present

## 2019-10-26 DIAGNOSIS — R82998 Other abnormal findings in urine: Secondary | ICD-10-CM | POA: Diagnosis not present

## 2019-10-26 DIAGNOSIS — Z9889 Other specified postprocedural states: Secondary | ICD-10-CM | POA: Diagnosis not present

## 2019-10-26 DIAGNOSIS — M5136 Other intervertebral disc degeneration, lumbar region: Secondary | ICD-10-CM | POA: Diagnosis not present

## 2019-10-26 DIAGNOSIS — M4316 Spondylolisthesis, lumbar region: Secondary | ICD-10-CM | POA: Diagnosis not present

## 2019-10-30 DIAGNOSIS — Z01812 Encounter for preprocedural laboratory examination: Secondary | ICD-10-CM | POA: Diagnosis not present

## 2019-10-30 DIAGNOSIS — N211 Calculus in urethra: Secondary | ICD-10-CM | POA: Diagnosis not present

## 2019-10-30 DIAGNOSIS — Z20822 Contact with and (suspected) exposure to covid-19: Secondary | ICD-10-CM | POA: Diagnosis not present

## 2019-11-06 DIAGNOSIS — N211 Calculus in urethra: Secondary | ICD-10-CM | POA: Diagnosis not present

## 2019-11-06 DIAGNOSIS — R399 Unspecified symptoms and signs involving the genitourinary system: Secondary | ICD-10-CM | POA: Diagnosis not present

## 2019-11-06 DIAGNOSIS — Z87828 Personal history of other (healed) physical injury and trauma: Secondary | ICD-10-CM | POA: Diagnosis not present

## 2019-11-06 DIAGNOSIS — N35016 Post-traumatic urethral stricture, male, overlapping sites: Secondary | ICD-10-CM | POA: Diagnosis not present

## 2019-11-08 DIAGNOSIS — T83098A Other mechanical complication of other indwelling urethral catheter, initial encounter: Secondary | ICD-10-CM | POA: Diagnosis not present

## 2019-11-23 DIAGNOSIS — L309 Dermatitis, unspecified: Secondary | ICD-10-CM | POA: Diagnosis not present

## 2019-11-23 DIAGNOSIS — R011 Cardiac murmur, unspecified: Secondary | ICD-10-CM | POA: Diagnosis not present

## 2019-11-23 DIAGNOSIS — Z6820 Body mass index (BMI) 20.0-20.9, adult: Secondary | ICD-10-CM | POA: Diagnosis not present

## 2019-12-11 DIAGNOSIS — N35914 Unspecified anterior urethral stricture, male: Secondary | ICD-10-CM | POA: Insufficient documentation

## 2019-12-11 DIAGNOSIS — Z466 Encounter for fitting and adjustment of urinary device: Secondary | ICD-10-CM | POA: Diagnosis not present

## 2019-12-11 DIAGNOSIS — R399 Unspecified symptoms and signs involving the genitourinary system: Secondary | ICD-10-CM | POA: Diagnosis not present

## 2019-12-11 DIAGNOSIS — F17210 Nicotine dependence, cigarettes, uncomplicated: Secondary | ICD-10-CM | POA: Diagnosis not present

## 2019-12-11 HISTORY — DX: Unspecified anterior urethral stricture, male: N35.914

## 2019-12-28 DIAGNOSIS — E1169 Type 2 diabetes mellitus with other specified complication: Secondary | ICD-10-CM | POA: Diagnosis not present

## 2020-01-06 DIAGNOSIS — Z1331 Encounter for screening for depression: Secondary | ICD-10-CM | POA: Diagnosis not present

## 2020-01-06 DIAGNOSIS — Z7189 Other specified counseling: Secondary | ICD-10-CM | POA: Diagnosis not present

## 2020-01-06 DIAGNOSIS — Z136 Encounter for screening for cardiovascular disorders: Secondary | ICD-10-CM | POA: Diagnosis not present

## 2020-01-06 DIAGNOSIS — E785 Hyperlipidemia, unspecified: Secondary | ICD-10-CM | POA: Diagnosis not present

## 2020-01-06 DIAGNOSIS — F17210 Nicotine dependence, cigarettes, uncomplicated: Secondary | ICD-10-CM | POA: Diagnosis not present

## 2020-01-06 DIAGNOSIS — E1129 Type 2 diabetes mellitus with other diabetic kidney complication: Secondary | ICD-10-CM | POA: Diagnosis not present

## 2020-01-06 DIAGNOSIS — E1169 Type 2 diabetes mellitus with other specified complication: Secondary | ICD-10-CM | POA: Diagnosis not present

## 2020-01-06 DIAGNOSIS — Z0000 Encounter for general adult medical examination without abnormal findings: Secondary | ICD-10-CM | POA: Diagnosis not present

## 2020-01-06 DIAGNOSIS — Z1339 Encounter for screening examination for other mental health and behavioral disorders: Secondary | ICD-10-CM | POA: Diagnosis not present

## 2020-01-06 DIAGNOSIS — Z139 Encounter for screening, unspecified: Secondary | ICD-10-CM | POA: Diagnosis not present

## 2020-01-06 DIAGNOSIS — E1165 Type 2 diabetes mellitus with hyperglycemia: Secondary | ICD-10-CM | POA: Diagnosis not present

## 2020-01-08 DIAGNOSIS — N323 Diverticulum of bladder: Secondary | ICD-10-CM | POA: Diagnosis not present

## 2020-01-08 DIAGNOSIS — N3289 Other specified disorders of bladder: Secondary | ICD-10-CM | POA: Diagnosis not present

## 2020-01-08 DIAGNOSIS — N35914 Unspecified anterior urethral stricture, male: Secondary | ICD-10-CM | POA: Diagnosis not present

## 2020-01-08 DIAGNOSIS — Z466 Encounter for fitting and adjustment of urinary device: Secondary | ICD-10-CM | POA: Diagnosis not present

## 2020-01-08 DIAGNOSIS — N35812 Other urethral bulbous stricture, male: Secondary | ICD-10-CM | POA: Diagnosis not present

## 2020-01-09 DIAGNOSIS — N323 Diverticulum of bladder: Secondary | ICD-10-CM

## 2020-01-09 DIAGNOSIS — R339 Retention of urine, unspecified: Secondary | ICD-10-CM

## 2020-01-09 HISTORY — DX: Retention of urine, unspecified: R33.9

## 2020-01-09 HISTORY — DX: Diverticulum of bladder: N32.3

## 2020-02-25 DIAGNOSIS — R339 Retention of urine, unspecified: Secondary | ICD-10-CM | POA: Diagnosis not present

## 2020-02-25 DIAGNOSIS — Z466 Encounter for fitting and adjustment of urinary device: Secondary | ICD-10-CM | POA: Diagnosis not present

## 2020-03-29 DIAGNOSIS — Z466 Encounter for fitting and adjustment of urinary device: Secondary | ICD-10-CM | POA: Diagnosis not present

## 2020-03-29 DIAGNOSIS — Z9359 Other cystostomy status: Secondary | ICD-10-CM | POA: Insufficient documentation

## 2020-03-29 DIAGNOSIS — R339 Retention of urine, unspecified: Secondary | ICD-10-CM | POA: Diagnosis not present

## 2020-03-29 HISTORY — DX: Other cystostomy status: Z93.59

## 2020-05-09 DIAGNOSIS — E1169 Type 2 diabetes mellitus with other specified complication: Secondary | ICD-10-CM | POA: Diagnosis not present

## 2020-05-09 DIAGNOSIS — Z936 Other artificial openings of urinary tract status: Secondary | ICD-10-CM | POA: Diagnosis not present

## 2020-05-09 DIAGNOSIS — Z125 Encounter for screening for malignant neoplasm of prostate: Secondary | ICD-10-CM | POA: Diagnosis not present

## 2020-05-09 DIAGNOSIS — T83090A Other mechanical complication of cystostomy catheter, initial encounter: Secondary | ICD-10-CM | POA: Diagnosis not present

## 2020-05-12 DIAGNOSIS — Z466 Encounter for fitting and adjustment of urinary device: Secondary | ICD-10-CM | POA: Diagnosis not present

## 2020-05-12 DIAGNOSIS — T83090A Other mechanical complication of cystostomy catheter, initial encounter: Secondary | ICD-10-CM | POA: Diagnosis not present

## 2020-05-16 DIAGNOSIS — E1169 Type 2 diabetes mellitus with other specified complication: Secondary | ICD-10-CM | POA: Diagnosis not present

## 2020-05-16 DIAGNOSIS — E119 Type 2 diabetes mellitus without complications: Secondary | ICD-10-CM | POA: Diagnosis not present

## 2020-05-16 DIAGNOSIS — E1129 Type 2 diabetes mellitus with other diabetic kidney complication: Secondary | ICD-10-CM | POA: Diagnosis not present

## 2020-05-16 DIAGNOSIS — E1165 Type 2 diabetes mellitus with hyperglycemia: Secondary | ICD-10-CM | POA: Diagnosis not present

## 2020-05-16 DIAGNOSIS — E785 Hyperlipidemia, unspecified: Secondary | ICD-10-CM | POA: Diagnosis not present

## 2020-05-25 DIAGNOSIS — Z466 Encounter for fitting and adjustment of urinary device: Secondary | ICD-10-CM | POA: Diagnosis not present

## 2020-05-25 DIAGNOSIS — Z9359 Other cystostomy status: Secondary | ICD-10-CM | POA: Diagnosis not present

## 2020-05-25 DIAGNOSIS — T83091A Other mechanical complication of indwelling urethral catheter, initial encounter: Secondary | ICD-10-CM | POA: Diagnosis not present

## 2020-06-04 DIAGNOSIS — T83010A Breakdown (mechanical) of cystostomy catheter, initial encounter: Secondary | ICD-10-CM | POA: Diagnosis not present

## 2020-06-04 DIAGNOSIS — N1330 Unspecified hydronephrosis: Secondary | ICD-10-CM | POA: Diagnosis not present

## 2020-06-04 DIAGNOSIS — T83510A Infection and inflammatory reaction due to cystostomy catheter, initial encounter: Secondary | ICD-10-CM | POA: Diagnosis not present

## 2020-06-08 DIAGNOSIS — Z87448 Personal history of other diseases of urinary system: Secondary | ICD-10-CM | POA: Diagnosis not present

## 2020-06-08 DIAGNOSIS — T83010D Breakdown (mechanical) of cystostomy catheter, subsequent encounter: Secondary | ICD-10-CM | POA: Diagnosis not present

## 2020-06-14 DIAGNOSIS — Z466 Encounter for fitting and adjustment of urinary device: Secondary | ICD-10-CM | POA: Diagnosis not present

## 2020-06-14 DIAGNOSIS — N323 Diverticulum of bladder: Secondary | ICD-10-CM | POA: Diagnosis not present

## 2020-06-14 DIAGNOSIS — N35914 Unspecified anterior urethral stricture, male: Secondary | ICD-10-CM | POA: Diagnosis not present

## 2020-06-14 DIAGNOSIS — R339 Retention of urine, unspecified: Secondary | ICD-10-CM | POA: Diagnosis not present

## 2020-06-22 DIAGNOSIS — Z23 Encounter for immunization: Secondary | ICD-10-CM | POA: Diagnosis not present

## 2020-06-22 DIAGNOSIS — E1169 Type 2 diabetes mellitus with other specified complication: Secondary | ICD-10-CM | POA: Diagnosis not present

## 2020-06-22 DIAGNOSIS — E785 Hyperlipidemia, unspecified: Secondary | ICD-10-CM | POA: Diagnosis not present

## 2020-06-22 DIAGNOSIS — Z139 Encounter for screening, unspecified: Secondary | ICD-10-CM | POA: Diagnosis not present

## 2020-06-28 DIAGNOSIS — R310 Gross hematuria: Secondary | ICD-10-CM | POA: Diagnosis not present

## 2020-06-28 DIAGNOSIS — N3289 Other specified disorders of bladder: Secondary | ICD-10-CM | POA: Diagnosis not present

## 2020-07-05 DIAGNOSIS — E1169 Type 2 diabetes mellitus with other specified complication: Secondary | ICD-10-CM | POA: Diagnosis not present

## 2020-07-05 DIAGNOSIS — E785 Hyperlipidemia, unspecified: Secondary | ICD-10-CM | POA: Diagnosis not present

## 2020-07-12 DIAGNOSIS — Z466 Encounter for fitting and adjustment of urinary device: Secondary | ICD-10-CM | POA: Diagnosis not present

## 2020-08-01 DIAGNOSIS — Z466 Encounter for fitting and adjustment of urinary device: Secondary | ICD-10-CM | POA: Diagnosis not present

## 2020-08-01 DIAGNOSIS — T83091A Other mechanical complication of indwelling urethral catheter, initial encounter: Secondary | ICD-10-CM | POA: Diagnosis not present

## 2020-08-18 DIAGNOSIS — Z9359 Other cystostomy status: Secondary | ICD-10-CM | POA: Diagnosis not present

## 2020-08-23 DIAGNOSIS — E1169 Type 2 diabetes mellitus with other specified complication: Secondary | ICD-10-CM | POA: Diagnosis not present

## 2020-08-29 DIAGNOSIS — E1129 Type 2 diabetes mellitus with other diabetic kidney complication: Secondary | ICD-10-CM | POA: Diagnosis not present

## 2020-08-29 DIAGNOSIS — Z6820 Body mass index (BMI) 20.0-20.9, adult: Secondary | ICD-10-CM | POA: Diagnosis not present

## 2020-08-31 DIAGNOSIS — T83198A Other mechanical complication of other urinary devices and implants, initial encounter: Secondary | ICD-10-CM | POA: Diagnosis not present

## 2020-08-31 DIAGNOSIS — Z466 Encounter for fitting and adjustment of urinary device: Secondary | ICD-10-CM | POA: Diagnosis not present

## 2020-09-13 DIAGNOSIS — Z466 Encounter for fitting and adjustment of urinary device: Secondary | ICD-10-CM | POA: Diagnosis not present

## 2020-09-26 DIAGNOSIS — Z9359 Other cystostomy status: Secondary | ICD-10-CM | POA: Diagnosis not present

## 2020-10-04 DIAGNOSIS — Z466 Encounter for fitting and adjustment of urinary device: Secondary | ICD-10-CM | POA: Diagnosis not present

## 2020-10-17 DIAGNOSIS — Z9359 Other cystostomy status: Secondary | ICD-10-CM | POA: Diagnosis not present

## 2020-10-31 DIAGNOSIS — Z9359 Other cystostomy status: Secondary | ICD-10-CM | POA: Diagnosis not present

## 2020-11-14 DIAGNOSIS — Z466 Encounter for fitting and adjustment of urinary device: Secondary | ICD-10-CM | POA: Diagnosis not present

## 2020-11-25 DIAGNOSIS — Z4803 Encounter for change or removal of drains: Secondary | ICD-10-CM | POA: Diagnosis not present

## 2020-12-09 DIAGNOSIS — Z466 Encounter for fitting and adjustment of urinary device: Secondary | ICD-10-CM | POA: Diagnosis not present

## 2020-12-20 DIAGNOSIS — Z4803 Encounter for change or removal of drains: Secondary | ICD-10-CM | POA: Diagnosis not present

## 2020-12-22 DIAGNOSIS — Z13220 Encounter for screening for lipoid disorders: Secondary | ICD-10-CM | POA: Diagnosis not present

## 2020-12-22 DIAGNOSIS — E1129 Type 2 diabetes mellitus with other diabetic kidney complication: Secondary | ICD-10-CM | POA: Diagnosis not present

## 2020-12-28 DIAGNOSIS — E785 Hyperlipidemia, unspecified: Secondary | ICD-10-CM | POA: Diagnosis not present

## 2020-12-28 DIAGNOSIS — E1129 Type 2 diabetes mellitus with other diabetic kidney complication: Secondary | ICD-10-CM | POA: Diagnosis not present

## 2020-12-28 DIAGNOSIS — Z681 Body mass index (BMI) 19 or less, adult: Secondary | ICD-10-CM | POA: Diagnosis not present

## 2021-01-04 DIAGNOSIS — Z466 Encounter for fitting and adjustment of urinary device: Secondary | ICD-10-CM | POA: Diagnosis not present

## 2021-01-17 DIAGNOSIS — Z466 Encounter for fitting and adjustment of urinary device: Secondary | ICD-10-CM | POA: Diagnosis not present

## 2021-02-01 DIAGNOSIS — Z4803 Encounter for change or removal of drains: Secondary | ICD-10-CM | POA: Diagnosis not present

## 2021-02-15 DIAGNOSIS — Z4803 Encounter for change or removal of drains: Secondary | ICD-10-CM | POA: Diagnosis not present

## 2021-03-01 DIAGNOSIS — Z9359 Other cystostomy status: Secondary | ICD-10-CM | POA: Diagnosis not present

## 2021-03-01 DIAGNOSIS — Z466 Encounter for fitting and adjustment of urinary device: Secondary | ICD-10-CM | POA: Diagnosis not present

## 2021-03-14 DIAGNOSIS — F17210 Nicotine dependence, cigarettes, uncomplicated: Secondary | ICD-10-CM | POA: Diagnosis not present

## 2021-03-14 DIAGNOSIS — Z20822 Contact with and (suspected) exposure to covid-19: Secondary | ICD-10-CM | POA: Diagnosis not present

## 2021-03-14 DIAGNOSIS — R059 Cough, unspecified: Secondary | ICD-10-CM | POA: Diagnosis not present

## 2021-03-15 DIAGNOSIS — Z4803 Encounter for change or removal of drains: Secondary | ICD-10-CM | POA: Diagnosis not present

## 2021-03-28 DIAGNOSIS — Z4803 Encounter for change or removal of drains: Secondary | ICD-10-CM | POA: Diagnosis not present

## 2021-04-09 DIAGNOSIS — T83010A Breakdown (mechanical) of cystostomy catheter, initial encounter: Secondary | ICD-10-CM | POA: Diagnosis not present

## 2021-04-19 DIAGNOSIS — Z4803 Encounter for change or removal of drains: Secondary | ICD-10-CM | POA: Diagnosis not present

## 2021-04-21 DIAGNOSIS — B3789 Other sites of candidiasis: Secondary | ICD-10-CM | POA: Diagnosis not present

## 2021-04-21 DIAGNOSIS — Z23 Encounter for immunization: Secondary | ICD-10-CM | POA: Diagnosis not present

## 2021-04-21 DIAGNOSIS — E1129 Type 2 diabetes mellitus with other diabetic kidney complication: Secondary | ICD-10-CM | POA: Diagnosis not present

## 2021-04-21 DIAGNOSIS — Z125 Encounter for screening for malignant neoplasm of prostate: Secondary | ICD-10-CM | POA: Diagnosis not present

## 2021-04-21 DIAGNOSIS — E785 Hyperlipidemia, unspecified: Secondary | ICD-10-CM | POA: Diagnosis not present

## 2021-04-24 DIAGNOSIS — M50321 Other cervical disc degeneration at C4-C5 level: Secondary | ICD-10-CM | POA: Diagnosis not present

## 2021-04-24 DIAGNOSIS — M2578 Osteophyte, vertebrae: Secondary | ICD-10-CM | POA: Diagnosis not present

## 2021-04-24 DIAGNOSIS — S0101XA Laceration without foreign body of scalp, initial encounter: Secondary | ICD-10-CM | POA: Diagnosis not present

## 2021-04-24 DIAGNOSIS — I361 Nonrheumatic tricuspid (valve) insufficiency: Secondary | ICD-10-CM | POA: Diagnosis not present

## 2021-04-24 DIAGNOSIS — R58 Hemorrhage, not elsewhere classified: Secondary | ICD-10-CM | POA: Diagnosis not present

## 2021-04-24 DIAGNOSIS — I340 Nonrheumatic mitral (valve) insufficiency: Secondary | ICD-10-CM | POA: Diagnosis not present

## 2021-04-24 DIAGNOSIS — J439 Emphysema, unspecified: Secondary | ICD-10-CM | POA: Diagnosis not present

## 2021-04-24 DIAGNOSIS — R0902 Hypoxemia: Secondary | ICD-10-CM | POA: Diagnosis not present

## 2021-04-24 DIAGNOSIS — R52 Pain, unspecified: Secondary | ICD-10-CM | POA: Diagnosis not present

## 2021-04-24 DIAGNOSIS — S199XXA Unspecified injury of neck, initial encounter: Secondary | ICD-10-CM | POA: Diagnosis not present

## 2021-04-24 DIAGNOSIS — R55 Syncope and collapse: Secondary | ICD-10-CM | POA: Diagnosis not present

## 2021-04-24 DIAGNOSIS — R4182 Altered mental status, unspecified: Secondary | ICD-10-CM | POA: Diagnosis not present

## 2021-04-24 DIAGNOSIS — R519 Headache, unspecified: Secondary | ICD-10-CM | POA: Diagnosis not present

## 2021-04-24 DIAGNOSIS — R079 Chest pain, unspecified: Secondary | ICD-10-CM | POA: Diagnosis not present

## 2021-04-25 DIAGNOSIS — I25118 Atherosclerotic heart disease of native coronary artery with other forms of angina pectoris: Secondary | ICD-10-CM | POA: Diagnosis not present

## 2021-04-25 DIAGNOSIS — E8770 Fluid overload, unspecified: Secondary | ICD-10-CM | POA: Diagnosis not present

## 2021-04-25 DIAGNOSIS — E43 Unspecified severe protein-calorie malnutrition: Secondary | ICD-10-CM | POA: Diagnosis not present

## 2021-04-25 DIAGNOSIS — Z7982 Long term (current) use of aspirin: Secondary | ICD-10-CM | POA: Diagnosis not present

## 2021-04-25 DIAGNOSIS — E11649 Type 2 diabetes mellitus with hypoglycemia without coma: Secondary | ICD-10-CM | POA: Diagnosis not present

## 2021-04-25 DIAGNOSIS — E1165 Type 2 diabetes mellitus with hyperglycemia: Secondary | ICD-10-CM | POA: Diagnosis not present

## 2021-04-25 DIAGNOSIS — I340 Nonrheumatic mitral (valve) insufficiency: Secondary | ICD-10-CM | POA: Diagnosis not present

## 2021-04-25 DIAGNOSIS — S0101XD Laceration without foreign body of scalp, subsequent encounter: Secondary | ICD-10-CM | POA: Diagnosis not present

## 2021-04-25 DIAGNOSIS — J439 Emphysema, unspecified: Secondary | ICD-10-CM | POA: Diagnosis not present

## 2021-04-25 DIAGNOSIS — F17210 Nicotine dependence, cigarettes, uncomplicated: Secondary | ICD-10-CM | POA: Diagnosis present

## 2021-04-25 DIAGNOSIS — Z9359 Other cystostomy status: Secondary | ICD-10-CM | POA: Diagnosis not present

## 2021-04-25 DIAGNOSIS — I25119 Atherosclerotic heart disease of native coronary artery with unspecified angina pectoris: Secondary | ICD-10-CM | POA: Diagnosis not present

## 2021-04-25 DIAGNOSIS — I2582 Chronic total occlusion of coronary artery: Secondary | ICD-10-CM | POA: Diagnosis not present

## 2021-04-25 DIAGNOSIS — I4510 Unspecified right bundle-branch block: Secondary | ICD-10-CM | POA: Diagnosis not present

## 2021-04-25 DIAGNOSIS — E7800 Pure hypercholesterolemia, unspecified: Secondary | ICD-10-CM | POA: Diagnosis present

## 2021-04-25 DIAGNOSIS — Z79899 Other long term (current) drug therapy: Secondary | ICD-10-CM | POA: Diagnosis not present

## 2021-04-25 DIAGNOSIS — M50321 Other cervical disc degeneration at C4-C5 level: Secondary | ICD-10-CM | POA: Diagnosis not present

## 2021-04-25 DIAGNOSIS — R4182 Altered mental status, unspecified: Secondary | ICD-10-CM | POA: Diagnosis not present

## 2021-04-25 DIAGNOSIS — M2578 Osteophyte, vertebrae: Secondary | ICD-10-CM | POA: Diagnosis not present

## 2021-04-25 DIAGNOSIS — D696 Thrombocytopenia, unspecified: Secondary | ICD-10-CM | POA: Diagnosis not present

## 2021-04-25 DIAGNOSIS — W1830XD Fall on same level, unspecified, subsequent encounter: Secondary | ICD-10-CM | POA: Diagnosis not present

## 2021-04-25 DIAGNOSIS — E755 Other lipid storage disorders: Secondary | ICD-10-CM | POA: Diagnosis not present

## 2021-04-25 DIAGNOSIS — Z83438 Family history of other disorder of lipoprotein metabolism and other lipidemia: Secondary | ICD-10-CM | POA: Diagnosis not present

## 2021-04-25 DIAGNOSIS — M109 Gout, unspecified: Secondary | ICD-10-CM | POA: Diagnosis present

## 2021-04-25 DIAGNOSIS — R55 Syncope and collapse: Secondary | ICD-10-CM | POA: Diagnosis not present

## 2021-04-25 DIAGNOSIS — N35919 Unspecified urethral stricture, male, unspecified site: Secondary | ICD-10-CM | POA: Diagnosis not present

## 2021-04-25 DIAGNOSIS — E785 Hyperlipidemia, unspecified: Secondary | ICD-10-CM | POA: Diagnosis not present

## 2021-04-25 DIAGNOSIS — Z881 Allergy status to other antibiotic agents status: Secondary | ICD-10-CM | POA: Diagnosis not present

## 2021-04-25 DIAGNOSIS — E119 Type 2 diabetes mellitus without complications: Secondary | ICD-10-CM | POA: Diagnosis present

## 2021-04-25 DIAGNOSIS — S199XXA Unspecified injury of neck, initial encounter: Secondary | ICD-10-CM | POA: Diagnosis not present

## 2021-04-25 DIAGNOSIS — I2510 Atherosclerotic heart disease of native coronary artery without angina pectoris: Secondary | ICD-10-CM | POA: Diagnosis not present

## 2021-04-25 DIAGNOSIS — Z7984 Long term (current) use of oral hypoglycemic drugs: Secondary | ICD-10-CM | POA: Diagnosis not present

## 2021-04-25 DIAGNOSIS — Z20822 Contact with and (suspected) exposure to covid-19: Secondary | ICD-10-CM | POA: Diagnosis present

## 2021-04-25 DIAGNOSIS — E1159 Type 2 diabetes mellitus with other circulatory complications: Secondary | ICD-10-CM | POA: Diagnosis not present

## 2021-04-25 DIAGNOSIS — Z01810 Encounter for preprocedural cardiovascular examination: Secondary | ICD-10-CM | POA: Diagnosis not present

## 2021-04-25 DIAGNOSIS — R079 Chest pain, unspecified: Secondary | ICD-10-CM | POA: Diagnosis not present

## 2021-04-25 DIAGNOSIS — D62 Acute posthemorrhagic anemia: Secondary | ICD-10-CM | POA: Diagnosis not present

## 2021-04-25 DIAGNOSIS — S0101XA Laceration without foreign body of scalp, initial encounter: Secondary | ICD-10-CM | POA: Diagnosis not present

## 2021-04-26 ENCOUNTER — Inpatient Hospital Stay (HOSPITAL_COMMUNITY)
Admission: AD | Admit: 2021-04-26 | Discharge: 2021-05-09 | DRG: 216 | Disposition: A | Discharge status: Home Health | Payer: Medicare Other | Source: Other Acute Inpatient Hospital | Attending: Thoracic Surgery (Cardiothoracic Vascular Surgery) | Admitting: Thoracic Surgery (Cardiothoracic Vascular Surgery)

## 2021-04-26 ENCOUNTER — Encounter (HOSPITAL_COMMUNITY): Admission: AD | Disposition: A | Discharge status: Home Health | Payer: Self-pay | Attending: Interventional Cardiology

## 2021-04-26 ENCOUNTER — Encounter (HOSPITAL_COMMUNITY): Payer: Self-pay | Admitting: Interventional Cardiology

## 2021-04-26 DIAGNOSIS — R55 Syncope and collapse: Secondary | ICD-10-CM

## 2021-04-26 DIAGNOSIS — Z83438 Family history of other disorder of lipoprotein metabolism and other lipidemia: Secondary | ICD-10-CM

## 2021-04-26 DIAGNOSIS — J939 Pneumothorax, unspecified: Secondary | ICD-10-CM

## 2021-04-26 DIAGNOSIS — I340 Nonrheumatic mitral (valve) insufficiency: Principal | ICD-10-CM | POA: Diagnosis present

## 2021-04-26 DIAGNOSIS — E755 Other lipid storage disorders: Secondary | ICD-10-CM | POA: Diagnosis not present

## 2021-04-26 DIAGNOSIS — I25118 Atherosclerotic heart disease of native coronary artery with other forms of angina pectoris: Secondary | ICD-10-CM | POA: Diagnosis not present

## 2021-04-26 DIAGNOSIS — E785 Hyperlipidemia, unspecified: Secondary | ICD-10-CM | POA: Diagnosis present

## 2021-04-26 DIAGNOSIS — E43 Unspecified severe protein-calorie malnutrition: Secondary | ICD-10-CM | POA: Insufficient documentation

## 2021-04-26 DIAGNOSIS — J90 Pleural effusion, not elsewhere classified: Secondary | ICD-10-CM | POA: Diagnosis not present

## 2021-04-26 DIAGNOSIS — N35919 Unspecified urethral stricture, male, unspecified site: Secondary | ICD-10-CM | POA: Diagnosis present

## 2021-04-26 DIAGNOSIS — Z7984 Long term (current) use of oral hypoglycemic drugs: Secondary | ICD-10-CM | POA: Diagnosis not present

## 2021-04-26 DIAGNOSIS — Z4682 Encounter for fitting and adjustment of non-vascular catheter: Secondary | ICD-10-CM | POA: Diagnosis not present

## 2021-04-26 DIAGNOSIS — Z9359 Other cystostomy status: Secondary | ICD-10-CM

## 2021-04-26 DIAGNOSIS — J984 Other disorders of lung: Secondary | ICD-10-CM | POA: Diagnosis not present

## 2021-04-26 DIAGNOSIS — Z951 Presence of aortocoronary bypass graft: Secondary | ICD-10-CM | POA: Diagnosis not present

## 2021-04-26 DIAGNOSIS — E1165 Type 2 diabetes mellitus with hyperglycemia: Secondary | ICD-10-CM | POA: Diagnosis not present

## 2021-04-26 DIAGNOSIS — I700 Atherosclerosis of aorta: Secondary | ICD-10-CM | POA: Diagnosis not present

## 2021-04-26 DIAGNOSIS — Z7982 Long term (current) use of aspirin: Secondary | ICD-10-CM | POA: Diagnosis not present

## 2021-04-26 DIAGNOSIS — E1159 Type 2 diabetes mellitus with other circulatory complications: Secondary | ICD-10-CM | POA: Diagnosis not present

## 2021-04-26 DIAGNOSIS — S0101XD Laceration without foreign body of scalp, subsequent encounter: Secondary | ICD-10-CM

## 2021-04-26 DIAGNOSIS — E8770 Fluid overload, unspecified: Secondary | ICD-10-CM | POA: Diagnosis not present

## 2021-04-26 DIAGNOSIS — I2510 Atherosclerotic heart disease of native coronary artery without angina pectoris: Secondary | ICD-10-CM | POA: Diagnosis present

## 2021-04-26 DIAGNOSIS — J9811 Atelectasis: Secondary | ICD-10-CM

## 2021-04-26 DIAGNOSIS — F17210 Nicotine dependence, cigarettes, uncomplicated: Secondary | ICD-10-CM | POA: Diagnosis present

## 2021-04-26 DIAGNOSIS — I083 Combined rheumatic disorders of mitral, aortic and tricuspid valves: Secondary | ICD-10-CM | POA: Diagnosis not present

## 2021-04-26 DIAGNOSIS — Z9889 Other specified postprocedural states: Secondary | ICD-10-CM | POA: Diagnosis not present

## 2021-04-26 DIAGNOSIS — E119 Type 2 diabetes mellitus without complications: Secondary | ICD-10-CM | POA: Diagnosis not present

## 2021-04-26 DIAGNOSIS — I25119 Atherosclerotic heart disease of native coronary artery with unspecified angina pectoris: Secondary | ICD-10-CM

## 2021-04-26 DIAGNOSIS — E11649 Type 2 diabetes mellitus with hypoglycemia without coma: Secondary | ICD-10-CM | POA: Diagnosis not present

## 2021-04-26 DIAGNOSIS — I4510 Unspecified right bundle-branch block: Secondary | ICD-10-CM | POA: Diagnosis not present

## 2021-04-26 DIAGNOSIS — Z419 Encounter for procedure for purposes other than remedying health state, unspecified: Secondary | ICD-10-CM

## 2021-04-26 DIAGNOSIS — Z881 Allergy status to other antibiotic agents status: Secondary | ICD-10-CM

## 2021-04-26 DIAGNOSIS — I081 Rheumatic disorders of both mitral and tricuspid valves: Secondary | ICD-10-CM | POA: Diagnosis not present

## 2021-04-26 DIAGNOSIS — D62 Acute posthemorrhagic anemia: Secondary | ICD-10-CM | POA: Diagnosis not present

## 2021-04-26 DIAGNOSIS — Z79899 Other long term (current) drug therapy: Secondary | ICD-10-CM | POA: Diagnosis not present

## 2021-04-26 DIAGNOSIS — I2582 Chronic total occlusion of coronary artery: Secondary | ICD-10-CM | POA: Diagnosis present

## 2021-04-26 DIAGNOSIS — Z01810 Encounter for preprocedural cardiovascular examination: Secondary | ICD-10-CM | POA: Diagnosis not present

## 2021-04-26 DIAGNOSIS — N390 Urinary tract infection, site not specified: Secondary | ICD-10-CM | POA: Diagnosis not present

## 2021-04-26 DIAGNOSIS — W1830XD Fall on same level, unspecified, subsequent encounter: Secondary | ICD-10-CM | POA: Diagnosis not present

## 2021-04-26 DIAGNOSIS — D696 Thrombocytopenia, unspecified: Secondary | ICD-10-CM | POA: Diagnosis not present

## 2021-04-26 DIAGNOSIS — Z952 Presence of prosthetic heart valve: Secondary | ICD-10-CM | POA: Diagnosis not present

## 2021-04-26 HISTORY — DX: Nonrheumatic mitral (valve) insufficiency: I34.0

## 2021-04-26 HISTORY — DX: Type 2 diabetes mellitus without complications: E11.9

## 2021-04-26 HISTORY — PX: RIGHT/LEFT HEART CATH AND CORONARY ANGIOGRAPHY: CATH118266

## 2021-04-26 HISTORY — DX: Hyperlipidemia, unspecified: E78.5

## 2021-04-26 HISTORY — DX: Tobacco use: Z72.0

## 2021-04-26 LAB — POCT I-STAT 7, (LYTES, BLD GAS, ICA,H+H)
Acid-Base Excess: 0 mmol/L (ref 0.0–2.0)
Acid-base deficit: 1 mmol/L (ref 0.0–2.0)
Bicarbonate: 23.3 mmol/L (ref 20.0–28.0)
Bicarbonate: 25.6 mmol/L (ref 20.0–28.0)
Calcium, Ion: 1.07 mmol/L — ABNORMAL LOW (ref 1.15–1.40)
Calcium, Ion: 1.16 mmol/L (ref 1.15–1.40)
HCT: 35 % — ABNORMAL LOW (ref 39.0–52.0)
HCT: 37 % — ABNORMAL LOW (ref 39.0–52.0)
Hemoglobin: 11.9 g/dL — ABNORMAL LOW (ref 13.0–17.0)
Hemoglobin: 12.6 g/dL — ABNORMAL LOW (ref 13.0–17.0)
O2 Saturation: 74 %
O2 Saturation: 99 %
Potassium: 3.5 mmol/L (ref 3.5–5.1)
Potassium: 3.7 mmol/L (ref 3.5–5.1)
Sodium: 138 mmol/L (ref 135–145)
Sodium: 140 mmol/L (ref 135–145)
TCO2: 24 mmol/L (ref 22–32)
TCO2: 27 mmol/L (ref 22–32)
pCO2 arterial: 37.3 mmHg (ref 32.0–48.0)
pCO2 arterial: 42.1 mmHg (ref 32.0–48.0)
pH, Arterial: 7.392 (ref 7.350–7.450)
pH, Arterial: 7.405 (ref 7.350–7.450)
pO2, Arterial: 139 mmHg — ABNORMAL HIGH (ref 83.0–108.0)
pO2, Arterial: 40 mmHg — CL (ref 83.0–108.0)

## 2021-04-26 LAB — GLUCOSE, CAPILLARY
Glucose-Capillary: 112 mg/dL — ABNORMAL HIGH (ref 70–99)
Glucose-Capillary: 213 mg/dL — ABNORMAL HIGH (ref 70–99)
Glucose-Capillary: 213 mg/dL — ABNORMAL HIGH (ref 70–99)

## 2021-04-26 SURGERY — RIGHT/LEFT HEART CATH AND CORONARY ANGIOGRAPHY
Anesthesia: LOCAL

## 2021-04-26 MED ORDER — SODIUM CHLORIDE 0.9% FLUSH: 3.0000 mL | Freq: Two times a day (BID) | INTRAVENOUS | Status: DC

## 2021-04-26 MED ORDER — HEPARIN (PORCINE) IN NACL 1000-0.9 UT/500ML-% IV SOLN
INTRAVENOUS | Status: AC
  Filled 2021-04-26: qty 1000

## 2021-04-26 MED ORDER — ONDANSETRON HCL 4 MG/2ML IJ SOLN: 4.0000 mg | Freq: Four times a day (QID) | INTRAMUSCULAR | Status: DC | PRN

## 2021-04-26 MED ORDER — SODIUM CHLORIDE 0.9 % IV SOLN: INTRAVENOUS | Status: AC

## 2021-04-26 MED ORDER — OXYBUTYNIN CHLORIDE 5 MG PO TABS
5.0000 mg | ORAL_TABLET | Freq: Every day | ORAL | Status: DC | PRN
  Administered 2021-04-29 – 2021-05-09 (×11): 5 mg via ORAL
  Filled 2021-04-26 (×18): qty 1

## 2021-04-26 MED ORDER — MIDAZOLAM HCL 2 MG/2ML IJ SOLN
INTRAMUSCULAR | Status: DC | PRN
  Administered 2021-04-26: 2 mg via INTRAVENOUS

## 2021-04-26 MED ORDER — MIDAZOLAM HCL 2 MG/2ML IJ SOLN
INTRAMUSCULAR | Status: AC
  Filled 2021-04-26: qty 2

## 2021-04-26 MED ORDER — SODIUM CHLORIDE 0.9% FLUSH
3.0000 mL | Freq: Two times a day (BID) | INTRAVENOUS | Status: DC
  Administered 2021-04-26 – 2021-05-02 (×12): 3 mL via INTRAVENOUS

## 2021-04-26 MED ORDER — LABETALOL HCL 5 MG/ML IV SOLN: 10.0000 mg | INTRAVENOUS | Status: AC | PRN

## 2021-04-26 MED ORDER — HYDRALAZINE HCL 20 MG/ML IJ SOLN: 10.0000 mg | INTRAMUSCULAR | Status: AC | PRN

## 2021-04-26 MED ORDER — SODIUM CHLORIDE 0.9 % IV SOLN: 250.0000 mL | INTRAVENOUS | Status: DC | PRN

## 2021-04-26 MED ORDER — INSULIN ASPART 100 UNIT/ML IJ SOLN
0.0000 [IU] | Freq: Every day | INTRAMUSCULAR | Status: DC
Start: 2021-04-26 — End: 2021-05-03
  Administered 2021-04-28: 3 [IU] via SUBCUTANEOUS

## 2021-04-26 MED ORDER — SODIUM CHLORIDE 0.9 % WEIGHT BASED INFUSION: 1.0000 mL/kg/h | INTRAVENOUS | Status: DC

## 2021-04-26 MED ORDER — LIDOCAINE HCL (PF) 1 % IJ SOLN
INTRAMUSCULAR | Status: DC | PRN
  Administered 2021-04-26 (×2): 2 mL

## 2021-04-26 MED ORDER — ACETAMINOPHEN 325 MG PO TABS: 650.0000 mg | ORAL_TABLET | ORAL | Status: DC | PRN

## 2021-04-26 MED ORDER — SODIUM CHLORIDE 0.9% FLUSH: 3.0000 mL | INTRAVENOUS | Status: DC | PRN

## 2021-04-26 MED ORDER — LIDOCAINE HCL (PF) 1 % IJ SOLN
INTRAMUSCULAR | Status: AC
  Filled 2021-04-26: qty 30

## 2021-04-26 MED ORDER — FENTANYL CITRATE (PF) 100 MCG/2ML IJ SOLN
INTRAMUSCULAR | Status: DC | PRN
  Administered 2021-04-26: 50 ug via INTRAVENOUS

## 2021-04-26 MED ORDER — ASPIRIN 81 MG PO CHEW: 81.0000 mg | CHEWABLE_TABLET | ORAL | Status: DC

## 2021-04-26 MED ORDER — IOHEXOL 350 MG/ML SOLN
INTRAVENOUS | Status: DC | PRN
  Administered 2021-04-26: 60 mL

## 2021-04-26 MED ORDER — VERAPAMIL HCL 2.5 MG/ML IV SOLN
INTRAVENOUS | Status: AC
  Filled 2021-04-26: qty 2

## 2021-04-26 MED ORDER — ATORVASTATIN CALCIUM 80 MG PO TABS
80.0000 mg | ORAL_TABLET | Freq: Every day | ORAL | Status: DC
  Administered 2021-04-26 – 2021-05-08 (×13): 80 mg via ORAL
  Filled 2021-04-26 (×13): qty 1

## 2021-04-26 MED ORDER — HEPARIN (PORCINE) IN NACL 1000-0.9 UT/500ML-% IV SOLN
INTRAVENOUS | Status: DC | PRN
  Administered 2021-04-26 (×2): 500 mL

## 2021-04-26 MED ORDER — FENTANYL CITRATE (PF) 100 MCG/2ML IJ SOLN
INTRAMUSCULAR | Status: AC
  Filled 2021-04-26: qty 2

## 2021-04-26 MED ORDER — HEPARIN SODIUM (PORCINE) 1000 UNIT/ML IJ SOLN
INTRAMUSCULAR | Status: DC | PRN
  Administered 2021-04-26: 3000 [IU] via INTRAVENOUS

## 2021-04-26 MED ORDER — INSULIN ASPART 100 UNIT/ML IJ SOLN
0.0000 [IU] | Freq: Three times a day (TID) | INTRAMUSCULAR | Status: DC
Start: 2021-04-26 — End: 2021-05-03
  Administered 2021-04-27: 1 [IU] via SUBCUTANEOUS
  Administered 2021-04-27: 2 [IU] via SUBCUTANEOUS
  Administered 2021-04-27: 3 [IU] via SUBCUTANEOUS
  Administered 2021-04-28: 2 [IU] via SUBCUTANEOUS
  Administered 2021-04-29: 3 [IU] via SUBCUTANEOUS
  Administered 2021-04-29: 2 [IU] via SUBCUTANEOUS
  Administered 2021-04-29: 1 [IU] via SUBCUTANEOUS
  Administered 2021-04-30: 7 [IU] via SUBCUTANEOUS
  Administered 2021-04-30 – 2021-05-01 (×3): 2 [IU] via SUBCUTANEOUS
  Administered 2021-05-01: 7 [IU] via SUBCUTANEOUS
  Administered 2021-05-02 (×3): 2 [IU] via SUBCUTANEOUS

## 2021-04-26 MED ORDER — SODIUM CHLORIDE 0.9 % WEIGHT BASED INFUSION: 3.0000 mL/kg/h | INTRAVENOUS | Status: DC

## 2021-04-26 MED ORDER — VERAPAMIL HCL 2.5 MG/ML IV SOLN
INTRAVENOUS | Status: DC | PRN
  Administered 2021-04-26: 10 mL via INTRA_ARTERIAL

## 2021-04-26 MED ORDER — HEPARIN SODIUM (PORCINE) 1000 UNIT/ML IJ SOLN
INTRAMUSCULAR | Status: AC
  Filled 2021-04-26: qty 1

## 2021-04-26 SURGICAL SUPPLY — 13 items
CATH 5FR JL3.5 JR4 ANG PIG MP (CATHETERS) ×2 IMPLANT
CATH BALLN WEDGE 5F 110CM (CATHETERS) ×2 IMPLANT
DEVICE RAD COMP TR BAND LRG (VASCULAR PRODUCTS) ×2 IMPLANT
GLIDESHEATH SLEND SS 6F .021 (SHEATH) ×2 IMPLANT
GUIDEWIRE INQWIRE 1.5J.035X260 (WIRE) ×1 IMPLANT
INQWIRE 1.5J .035X260CM (WIRE) ×2
KIT HEART LEFT (KITS) ×2 IMPLANT
PACK CARDIAC CATHETERIZATION (CUSTOM PROCEDURE TRAY) ×2 IMPLANT
SHEATH GLIDE SLENDER 4/5FR (SHEATH) ×2 IMPLANT
SHEATH PROBE COVER 6X72 (BAG) ×2 IMPLANT
TRANSDUCER W/STOPCOCK (MISCELLANEOUS) ×2 IMPLANT
TUBING CIL FLEX 10 FLL-RA (TUBING) ×2 IMPLANT
WIRE HI TORQ VERSACORE-J 145CM (WIRE) ×2 IMPLANT

## 2021-04-26 NOTE — Progress Notes (Signed)
Arrived by [facility] from [facility]. IV rt [facility]; 0.9NS started at 20cc/hr. Tal;king w/wife on personal phone. Dr. Clifton James talking w/patient

## 2021-04-26 NOTE — Interval H&P Note (Signed)
History and Physical Interval Note:  04/26/2021 1:19 PM  Joseph Robles  has presented today for surgery, with the diagnosis of cp.  The various methods of treatment have been discussed with the patient and family. After consideration of risks, benefits and other options for treatment, the patient has consented to  Procedure(s): RIGHT/LEFT HEART CATH AND CORONARY ANGIOGRAPHY (N/A) as a surgical intervention.  The patient's history has been reviewed, patient examined, no change in status, stable for surgery.  I have reviewed the patient's chart and labs.  Questions were answered to the patient's satisfaction.    Cath Lab Visit (complete for each Cath Lab visit)  Clinical Evaluation Leading to the Procedure:   ACS: No.  Non-ACS:    Anginal Classification: CCS I  Anti-ischemic medical therapy: No Therapy  Non-Invasive Test Results: No non-invasive testing performed  Prior CABG: No previous CABG        Verne Carrow

## 2021-04-26 NOTE — H&P (Addendum)
Cardiology Admission History and Physical:   Patient ID: Joseph Robles MRN: [id number]; DOB: 1951-02-05   Admission date: 04/26/2021  PCP:  Patient, No Pcp Per (Inactive)   [facility] Providers Cardiologist:  Dr. Tomie China     Chief Complaint:  syncope and mitral regurgitation  Patient Profile:   Joseph Robles is a 71 y.o. male with PMH HLD, DM type II, and tobacco abuse who is being seen 04/26/2021 for the evaluation of severe mitral regurgitation.  History of Present Illness:   Joseph Robles presented to [facility] following a syncopal episode.  He was reported to be sitting outside on his porch when he began to feel strange.  He got up to go inside but before he made it had a syncopal episode resulting in a laceration to posterior head.  When he regained consciousness his wife was reportedly standing over him but he did not recall how he ended up on the floor.  He had no complaints of chest pain, palpitations, diaphoresis, nausea, vomiting, shortness of breath in the peri-syncopal period.  He reported a similar episode 1 year ago.  He had 6 sutures to his posterior scalp in the ED.  EKG showed sinus rhythm, rate 73 bpm, peaked T waves, no STE/D.  Labs notable for sodium 129>134, creatinine 0.8, pro BNP 677, troponin negative x3, TSH WNL, A1c 7.0.  He was admitted to medicine.  Echocardiogram obtained showing EF 55-60%, mild RV enlargement, moderate LAE, mild TR, and severe MR.  He was seen by Dr. Tomie China with cardiology in consultation and was recommended to transfer to [facility] for cardiac catheterization.   Past Medical History:  Diagnosis Date   DM type 2 (diabetes mellitus, type 2) (HCC)    Hyperlipidemia    Tobacco abuse     Past Surgical History:  Procedure Laterality Date   URETERAL STENT PLACEMENT       Medications Prior to Admission: Prior to Admission medications   Medication Sig Start Date End Date Taking? Authorizing Provider  clotrimazole (LOTRIMIN)  1 % external solution Apply 1 application topically 2 (two) times daily. In between toes 05/08/17   Asencion Islam, DPM  metFORMIN (GLUCOPHAGE) 500 MG tablet  02/21/17   [provider]  Miconazole Nitrate 2 % AERO To foot for tinea daily 05/08/17   Asencion Islam, DPM     Allergies:    Allergies  Allergen Reactions   Neosporin [Neomycin-Bacitracin Zn-Polymyx] Rash    Social History:   Social History   Socioeconomic History   Marital status: Married    Spouse name: Not on file   Number of children: Not on file   Years of education: Not on file   Highest education level: Not on file  Occupational History   Not on file  Tobacco Use   Smoking status: Every Day    Packs/day: 0.50    Types: Cigarettes   Smokeless tobacco: Never  Substance and Sexual Activity   Alcohol use: Yes    Comment: social drinker   Drug use: No   Sexual activity: Not on file  Other Topics Concern   Not on file  Social History Narrative   Not on file   Social Determinants of Health   Financial Resource Strain: Not on file  Food Insecurity: Not on file  Transportation Needs: Not on file  Physical Activity: Not on file  Stress: Not on file  Social Connections: Not on file  Intimate Partner Violence: Not on file  Family History:   The patient's family history includes Hyperlipidemia in his father.    ROS:  Please see the history of present illness.  All other ROS reviewed and negative.     Physical Exam/Data:   Vitals:   04/26/21 1349 04/26/21 1354 04/26/21 1359 04/26/21 1404  BP: (!) 141/82 (!) 152/93 (!) 164/101 (!) 166/98  Pulse: 62 94 96 91  Resp: 13 19 14 13   SpO2: 100% 100% 100% 100%   No intake or output data in the 24 hours ending 04/26/21 1417 No flowsheet data found.   There is no height or weight on file to calculate BMI.  Physical exam per MD below  EKG:  sinus rhythm, rate 73 bpm, peaked T waves, no STE/D.  Relevant CV Studies: Echocardiogram 04/24/2021: 1.   The left ventricle is moderately dilated. 2.  Overall left ventricular systolic function is normal with an EF between 55-60% 3.  The right ventricle is mildly enlarged. 4.  Left atrium is moderately dilated by volume. 5.  There is mild aortic valve sclerosis. 6.  Severe mitral regurgitation is present.  Pulmonary vein reversal flow. 7.  Mild tricuspid regurgitation is present.  Laboratory Data:  High Sensitivity Troponin:  No results for input(s): TROPONINIHS in the last 720 hours.    Chemistry Recent Labs  Lab 04/26/21 1351 04/26/21 1353  NA 138 140  K 3.7 3.5    No results for input(s): PROT, ALBUMIN, AST, ALT, ALKPHOS, BILITOT in the last 168 hours. Lipids No results for input(s): CHOL, TRIG, HDL, LABVLDL, LDLCALC, CHOLHDL in the last 168 hours. Hematology Recent Labs  Lab 04/26/21 1351 04/26/21 1353  HGB 12.6* 11.9*  HCT 37.0* 35.0*   Thyroid No results for input(s): TSH, FREET4 in the last 168 hours. BNPNo results for input(s): BNP, PROBNP in the last 168 hours.  DDimer No results for input(s): DDIMER in the last 168 hours.   Radiology/Studies:  No results found.   Assessment and Plan:   1. Severe mitral regurgitation: Patient presented after a syncopal episode resulting in laceration to posterior head.  He had no prodromal chest pain, palpitations, diaphoresis, nausea, vomiting, shortness of breath.  Echocardiogram at [facility] showed EF 55-60%, basilar inferior akinesis with aneurysmal wall, and severe MR.  He was transferred to [facility] for cardiac catheterization to rule out ischemia. - Anticipate TEE this admission to further characterize mitral regurgitation.  We will discuss timing with MD.   2. Syncope: As above, presented with syncope and head strike.  No significant prodromal symptoms.  EKG was nonischemic.  Troponins negative x3.  Echo with EF 55-60%, +wall motion abnormality.  No arrhythmias noted on telemetry at [facility].  He was  transferred to [facility] for cardiac catheterization to rule out ischemia - Will follow-up cardiac catheterization findings - Continue to monitor on telemetry  3. HLD: No recent lipids on file - Will repeat FLP in AM - Will increase atorvastatin to 80mg  daily  4. DM type 2: A1c 7.0 on lipid at [facility]. On metformin and januvia at home - Continue ISS this admission - Likely restart home medications at discharge  5. Scalp laceration: 6 sutures to posterior scalp placed  - Anticipate removal 7-10 days after placement - evaluate readiness 05/02/21   Risk Assessment/Risk Scores:    Severity of Illness: The appropriate patient status for this patient is INPATIENT. Inpatient status is judged to be reasonable and necessary in order to provide the required intensity of service to  ensure the patient's safety. The patient's presenting symptoms, physical exam findings, and initial radiographic and laboratory data in the context of their chronic comorbidities is felt to place them at high risk for further clinical deterioration. Furthermore, it is not anticipated that the patient will be medically stable for discharge from the hospital within 2 midnights of admission. The following factors support the patient status of inpatient.   " The patient's presenting symptoms include syncope. " The worrisome physical exam findings include cardiac murmur. " The initial radiographic and laboratory data are worrisome because of severe mitral regurgitation on echocardiogram. " The chronic co-morbidities include HLD, DM type 2, tobacco abuse.   * I certify that at the point of admission it is my clinical judgment that the patient will require inpatient hospital care spanning beyond 2 midnights from the point of admission due to high intensity of service, high risk for further deterioration and high frequency of surveillance required.*   For questions or updates, please contact [facility] Please consult  [email or web address] for contact info under     Signed, Beatriz Stallion, PA-C  04/26/2021 2:17 PM   I have personally seen and examined this patient. I agree with the assessment and plan as outlined above.  Pt with severe MR. Transferred to [facility] for cath.  CAth with CTO of the RCA and severe Circumflex stenosis. Admit for TEE and CT surgery consult following syncopal event.   Verne Carrow 04/27/2021 10:23 AM

## 2021-04-26 NOTE — H&P (View-Only) (Signed)
Cath lab note:   70 yo male with history of tobacco abuse, HLD and DM admitted to [facility] after a syncopal event and found to have severe MR.  Pt transferred to [facility] for R and L heart cath today and admission here.   Full cardiology consult note is scanned.  Full H and P to follow.   Verne Carrow 04/26/2021 1:19 PM

## 2021-04-26 NOTE — Progress Notes (Signed)
Cath lab note:   70 yo male with history of tobacco abuse, HLD and DM admitted to [facility] after a syncopal event and found to have severe MR.  Pt transferred to [facility] for R and L heart cath today and admission here.   Full cardiology consult note is scanned.  Full H and P to follow.   Harnoor Kohles 04/26/2021 1:19 PM  

## 2021-04-27 ENCOUNTER — Encounter (HOSPITAL_COMMUNITY): Payer: Self-pay | Admitting: Cardiovascular Disease

## 2021-04-27 DIAGNOSIS — E1159 Type 2 diabetes mellitus with other circulatory complications: Secondary | ICD-10-CM | POA: Diagnosis not present

## 2021-04-27 DIAGNOSIS — R55 Syncope and collapse: Secondary | ICD-10-CM | POA: Diagnosis not present

## 2021-04-27 DIAGNOSIS — I25118 Atherosclerotic heart disease of native coronary artery with other forms of angina pectoris: Secondary | ICD-10-CM

## 2021-04-27 DIAGNOSIS — I340 Nonrheumatic mitral (valve) insufficiency: Secondary | ICD-10-CM | POA: Diagnosis not present

## 2021-04-27 LAB — BASIC METABOLIC PANEL
Anion gap: 8 (ref 5–15)
BUN: 12 mg/dL (ref 8–23)
CO2: 24 mmol/L (ref 22–32)
Calcium: 8.8 mg/dL — ABNORMAL LOW (ref 8.9–10.3)
Chloride: 98 mmol/L (ref 98–111)
Creatinine, Ser: 0.78 mg/dL (ref 0.61–1.24)
GFR, Estimated: >60 mL/min (ref >60)
Glucose, Bld: 135 mg/dL — ABNORMAL HIGH (ref 70–99)
Potassium: 3.8 mmol/L (ref 3.5–5.1)
Sodium: 130 mmol/L — ABNORMAL LOW (ref 135–145)

## 2021-04-27 LAB — CBC
HCT: 36.7 % — ABNORMAL LOW (ref 39.0–52.0)
Hemoglobin: 12.5 g/dL — ABNORMAL LOW (ref 13.0–17.0)
MCH: 29.7 pg (ref 26.0–34.0)
MCHC: 34.1 g/dL (ref 30.0–36.0)
MCV: 87.2 fL (ref 80.0–100.0)
Platelets: 229 10*3/uL (ref 150–400)
RBC: 4.21 MIL/uL — ABNORMAL LOW (ref 4.22–5.81)
RDW: 14.2 % (ref 11.5–15.5)
WBC: 8.3 10*3/uL (ref 4.0–10.5)
nRBC: 0 % (ref 0.0–0.2)

## 2021-04-27 LAB — GLUCOSE, CAPILLARY
Glucose-Capillary: 143 mg/dL — ABNORMAL HIGH (ref 70–99)
Glucose-Capillary: 237 mg/dL — ABNORMAL HIGH (ref 70–99)
Glucose-Capillary: 167 mg/dL — ABNORMAL HIGH (ref 70–99)
Glucose-Capillary: 121 mg/dL — ABNORMAL HIGH (ref 70–99)

## 2021-04-27 LAB — LIPID PANEL
Cholesterol: 143 mg/dL (ref 0–200)
HDL: 47 mg/dL (ref >40)
LDL Cholesterol: 85 mg/dL (ref 0–99)
Total CHOL/HDL Ratio: 3 RATIO
Triglycerides: 57 mg/dL (ref <150)
VLDL: 11 mg/dL (ref 0–40)

## 2021-04-27 MED ORDER — ASPIRIN EC 81 MG PO TBEC
81.0000 mg | DELAYED_RELEASE_TABLET | Freq: Every day | ORAL | Status: DC
  Administered 2021-04-27 – 2021-05-02 (×6): 81 mg via ORAL
  Filled 2021-04-27 (×6): qty 1

## 2021-04-27 NOTE — Progress Notes (Addendum)
Progress Note  Patient Name: Joseph Robles Date of Encounter: 04/27/2021  [facility] Cardiologist: None   Subjective   No complaints this morning.   Inpatient Medications    Scheduled Meds:  atorvastatin  80 mg Oral QHS   insulin aspart  0-5 Units Subcutaneous QHS   insulin aspart  0-9 Units Subcutaneous TID WC   sodium chloride flush  3 mL Intravenous Q12H   Continuous Infusions:  sodium chloride     PRN Meds: sodium chloride, acetaminophen, ondansetron (ZOFRAN) IV, oxybutynin, sodium chloride flush   Vital Signs    Vitals:   04/26/21 1624 04/26/21 1933 04/27/21 0122 04/27/21 0534  BP: 128/74 131/77 128/82 123/76  Pulse:  63 60 72  Resp:  19  16  Temp:  98.1 F (36.7 C)  98.3 F (36.8 C)  TempSrc:  Oral  Oral  SpO2: 99% 98% 98% 96%    Intake/Output Summary (Last 24 hours) at 04/27/2021 0809 Last data filed at 04/27/2021 0500 Gross per 24 hour  Intake 69.17 ml  Output 1500 ml  Net -1430.83 ml   No flowsheet data found.    Telemetry    SR - Personally Reviewed  ECG    No new tracing this morning  Physical Exam   GEN: No acute distress.   Neck: No JVD Cardiac: RRR, 3/6 systolic murmur LLSB, no rubs, or gallops.  Respiratory: Clear to auscultation bilaterally. GI: Soft, nontender, non-distended  MS: No edema; No deformity. Neuro:  Nonfocal  Psych: Normal affect   Labs    High Sensitivity Troponin:  No results for input(s): TROPONINIHS in the last 720 hours.   Chemistry Recent Labs  Lab 04/26/21 1351 04/26/21 1353 04/27/21 0224  NA 138 140 130*  K 3.7 3.5 3.8  CL  --   --  98  CO2  --   --  24  GLUCOSE  --   --  135*  BUN  --   --  12  CREATININE  --   --  0.78  CALCIUM  --   --  8.8*  GFRNONAA  --   --  >60  ANIONGAP  --   --  8    Lipids  Recent Labs  Lab 04/27/21 0224  CHOL 143  TRIG 57  HDL 47  LDLCALC 85  CHOLHDL 3.0    Hematology Recent Labs  Lab 04/26/21 1351 04/26/21 1353 04/27/21 0224  WBC  --   --  8.3   RBC  --   --  4.21*  HGB 12.6* 11.9* 12.5*  HCT 37.0* 35.0* 36.7*  MCV  --   --  87.2  MCH  --   --  29.7  MCHC  --   --  34.1  RDW  --   --  14.2  PLT  --   --  229   Thyroid No results for input(s): TSH, FREET4 in the last 168 hours.  BNPNo results for input(s): BNP, PROBNP in the last 168 hours.  DDimer No results for input(s): DDIMER in the last 168 hours.   Radiology    CARDIAC CATHETERIZATION  Addendum Date: 04/26/2021     Prox RCA to Mid RCA lesion is 100% stenosed.   Prox RCA lesion is 70% stenosed.   Prox Cx to Mid Cx lesion is 80% stenosed.   Mid LAD to Dist LAD lesion is 30% stenosed.   LV end diastolic pressure is normal. Chronic total occlusion of the mid RCA. The mid  and distal vessel fills from left to right collaterals. Severe proximal Circumflex stenosis Mild mid LAD stenosis Large V waves suggestive of severe mitral regurgitation. (Echo with severe MR) Recommendations: He will be admitted to our cardiology service. Continue workup for potential mitral valve surgery and CABG. He will likely need a TEE. Will ask CT surgery to see him after the TEE.   Result Date: 04/26/2021   Prox RCA to Mid RCA lesion is 100% stenosed.   Prox RCA lesion is 70% stenosed.   Prox Cx to Mid Cx lesion is 80% stenosed.   Mid LAD to Dist LAD lesion is 30% stenosed.   LV end diastolic pressure is normal. Chronic total occlusion of the mid RCA. The mid and distal vessel fills from left to right collaterals. Severe proximal Circumflex stenosis Mild mid LAD stenosis Large V waves suggestive of severe mitral regurgitation. (Echo with severe MR) Recommendations: He will be admitted to our cardiology service. Continue workup for potential mitral valve surgery and CABG. He will likely need a TEE. Will ask CT surgery to see him after the TEE.    Cardiac Studies   Cath: 04/26/21  Prox RCA to Mid RCA lesion is 100% stenosed.   Prox RCA lesion is 70% stenosed.   Prox Cx to Mid Cx lesion is 80% stenosed.    Mid LAD to Dist LAD lesion is 30% stenosed.   LV end diastolic pressure is normal.   Chronic total occlusion of the mid RCA. The mid and distal vessel fills from left to right collaterals.  Severe proximal Circumflex stenosis Mild mid LAD stenosis Large V waves suggestive of severe mitral regurgitation. (Echo with severe MR)   Recommendations: He will be admitted to our cardiology service. Continue workup for potential mitral valve surgery and CABG. He will likely need a TEE. Will ask CT surgery to see him after the TEE.   Diagnostic Dominance: Right   Patient Profile     70 y.o. male  with PMH HLD, DM type II, and tobacco abuse who is being seen 04/26/2021 for the evaluation of severe mitral regurgitation.  Assessment & Plan    Severe mitral regurgitation: Patient presented after a syncopal episode resulting in laceration to posterior head.  He had no prodromal chest pain, palpitations, diaphoresis, nausea, vomiting, shortness of breath.  Echocardiogram at [facility] showed EF 55-60%, basilar inferior akinesis with aneurysmal wall, and severe MR.  He was transferred to [facility] for further work up. Cath noted above, large V waves suggestive of severe mitral regurg.  -- planned for TEE but unfortunately first available slot isn't until Monday. Will make NPO this evening in the event a slot opens for tomorrow. TCTS consulted, plan to see patient once TEE completed  CAD: Underwent cardiac cath noted above with pRCA 70% mRCA 100% with left to right collaterals, pLcx 80%. Recommendations for TCTS consult for CABG + mitral valve surgery.  -- on statin, statin  Syncope: As above, presented with syncope and head strike.  No significant prodromal symptoms.  EKG was nonischemic.  Troponins negative x3.  Echo with EF 55-60%, +wall motion abnormality.  No arrhythmias noted on telemetry at [facility].  He was transferred to [facility] for cardiac catheterization noted above.  -- follow  telemetry  HLD: LDL 85 -- increased atorvastatin to 80mg  daily on admission   DM type 2: A1c 7.0 on lipid at [facility]. On metformin and januvia at home -- Continue ISS this admission  Scalp laceration:  6 sutures to posterior scalp placed  --Anticipate removal 7-10 days after placement - evaluate readiness 05/02/21  For questions or updates, please contact [facility] Please consult [email or web address] for contact info under     Signed, Laverda Page, NP  04/27/2021, 8:09 AM    I have examined the patient and reviewed assessment and plan and discussed with patient.  Agree with above as stated.    Right radial cath site stable.  Plan for TEE when slot opens up.  Further cardiac surgical plans based on TEE result.  Currently stable.  Rhythm stable.  We will also watch on monitor.  Medical therapy for coronary artery disease including high-dose statin.  Diabetes control as well.  Lance Muss

## 2021-04-28 ENCOUNTER — Inpatient Hospital Stay (HOSPITAL_COMMUNITY): Payer: Medicare Other

## 2021-04-28 ENCOUNTER — Inpatient Hospital Stay (HOSPITAL_COMMUNITY): Payer: Medicare Other | Admitting: Anesthesiology

## 2021-04-28 ENCOUNTER — Encounter (HOSPITAL_COMMUNITY): Payer: Self-pay | Admitting: Interventional Cardiology

## 2021-04-28 ENCOUNTER — Encounter (HOSPITAL_COMMUNITY): Admission: AD | Disposition: A | Discharge status: Home Health | Payer: Self-pay | Attending: Interventional Cardiology

## 2021-04-28 ENCOUNTER — Other Ambulatory Visit: Payer: Self-pay

## 2021-04-28 DIAGNOSIS — I340 Nonrheumatic mitral (valve) insufficiency: Secondary | ICD-10-CM | POA: Diagnosis not present

## 2021-04-28 DIAGNOSIS — R55 Syncope and collapse: Secondary | ICD-10-CM | POA: Diagnosis not present

## 2021-04-28 DIAGNOSIS — E1159 Type 2 diabetes mellitus with other circulatory complications: Secondary | ICD-10-CM | POA: Diagnosis not present

## 2021-04-28 DIAGNOSIS — E755 Other lipid storage disorders: Secondary | ICD-10-CM | POA: Diagnosis not present

## 2021-04-28 DIAGNOSIS — I2510 Atherosclerotic heart disease of native coronary artery without angina pectoris: Secondary | ICD-10-CM | POA: Diagnosis not present

## 2021-04-28 DIAGNOSIS — I25118 Atherosclerotic heart disease of native coronary artery with other forms of angina pectoris: Secondary | ICD-10-CM | POA: Diagnosis not present

## 2021-04-28 HISTORY — PX: TEE WITHOUT CARDIOVERSION: SHX5443

## 2021-04-28 LAB — GLUCOSE, CAPILLARY
Glucose-Capillary: 159 mg/dL — ABNORMAL HIGH (ref 70–99)
Glucose-Capillary: 123 mg/dL — ABNORMAL HIGH (ref 70–99)
Glucose-Capillary: 117 mg/dL — ABNORMAL HIGH (ref 70–99)
Glucose-Capillary: 282 mg/dL — ABNORMAL HIGH (ref 70–99)

## 2021-04-28 LAB — ECHO TEE
MV M vel: 5.5 m/s
MV Peak grad: 121 mmHg
Radius: 1 cm

## 2021-04-28 SURGERY — ECHOCARDIOGRAM, TRANSESOPHAGEAL
Anesthesia: Monitor Anesthesia Care

## 2021-04-28 MED ORDER — SODIUM CHLORIDE 0.9 % IV SOLN: INTRAVENOUS | Status: DC

## 2021-04-28 MED ORDER — PROPOFOL 10 MG/ML IV BOLUS
INTRAVENOUS | Status: DC | PRN
  Administered 2021-04-28: 20 mg via INTRAVENOUS

## 2021-04-28 MED ORDER — LIDOCAINE VISCOUS HCL 2 % MT SOLN
OROMUCOSAL | Status: DC | PRN
  Administered 2021-04-28: 10 mL via OROMUCOSAL

## 2021-04-28 MED ORDER — SODIUM CHLORIDE 0.9 % IV SOLN: INTRAVENOUS | Status: DC | PRN

## 2021-04-28 MED ORDER — LIDOCAINE VISCOUS HCL 2 % MT SOLN
OROMUCOSAL | Status: AC
  Filled 2021-04-28: qty 15

## 2021-04-28 MED ORDER — PROPOFOL 500 MG/50ML IV EMUL
INTRAVENOUS | Status: DC | PRN
  Administered 2021-04-28: 125 ug/kg/min via INTRAVENOUS

## 2021-04-28 MED ORDER — PHENYLEPHRINE 40 MCG/ML (10ML) SYRINGE FOR IV PUSH (FOR BLOOD PRESSURE SUPPORT)
PREFILLED_SYRINGE | INTRAVENOUS | Status: DC | PRN
  Administered 2021-04-28 (×2): 80 ug via INTRAVENOUS

## 2021-04-28 NOTE — Care Management Important Message (Signed)
Important Message  Patient Details  Name: Joseph Robles MRN: [id number] Date of Birth: 25-Jul-1951   Medicare Important Message Given:  Yes     Lauranne Beyersdorf Stefan Church 04/28/2021, 4:10 PM

## 2021-04-28 NOTE — Progress Notes (Signed)
  Echocardiogram Echocardiogram Transesophageal has been performed.  Joseph Robles F 04/28/2021, 2:42 PM

## 2021-04-28 NOTE — H&P (View-Only) (Signed)
[address]       [place] [address]             [phone]        [name] [facility] Medical Record #[id number] Date of Birth: 01-09-1951  Referring: Corky Crafts, MD  Primary Care: None on file  primary Cardiologist: Dr. Tomie China  Reason for Consult: Surgical management of severe mitral insufficiency and two-vessel coronary artery  History of Present Illness:     Joseph Robles is a 70 year old male with past history significant for Dyslipidemia, type 2 diabetes mellitus, and tobacco use, and urethral stricture managed with a suprapubic catheter who presented to the [facility] 5 days ago following a syncopal episode.  He was in his usual state of health earlier that day when he began to feel strange while sitting in his living room.  He attempted to go outside t".  "He had some fresh air" but had a syncopal episode falling backwards and lacerating his head.  His wife witnessed the event and said he was unconscious for about 20 seconds.  He brought to [facility] via EMS.  His scalp laceration was repaired.  Further work-up for syncope included an EKG that showed sinus rhythm but no ST elevations or depressions.  Serum chemistries were unremarkable.  His BNP was 677, troponin was negative x3.  An echocardiogram obtained in the ED demonstrated an ejection fraction of 55 to 60%.  There was mild RV enlargement and moderate left atrial enlargement.  There was severe mitral insufficiency noted.  Joseph Robles also reports he had a chest x-ray and CT scan of his head while at [facility] but I am not able to find reports of these studies.     Joseph Robles was seen by Dr. Tomie China in consultation and transfer to [facility] for left heart catheterization was recommended.   Joseph Robles arrived to [facility] on 04/26/2021.  Left heart catheterization on that date demonstrated two-vessel coronary artery disease with a 70% proximal RCA  stenosis followed by mid total occlusion of the RCA.  There was left-to-right collaterals in place.  In addition, there was an 80% proximal circumflex stenosis before the first OM.  The LAD had a long 30% stenosis in its midportion.  Joseph Robles remained stable.  The cardiology service recommended further evaluation of the mitral valve with transesophageal echo.  That study occurred earlier today and results are pending.  CT surgery has been asked to evaluate Joseph Robles for consideration of coronary artery bypass grafting and mitral valve repair or replacement.  Joseph Robles has had no further symptoms since the syncopal episode on Monday of this week.  He does tell me, though, that he believes his health has been declining over the last year and a half.  This started with a febrile illness with associated nausea and vomiting that lasted 3 to 4 days.  Since then, he has had decreased appetite and a steady weight loss that he has totaled about 30 pounds.  Around the same time, he also developed a urethral stricture.  He has seen 3 different urologist for management this.  He currently has a suprapubic catheter in place that he says he gets obstructed fairly frequently.  He says he has to have this changed about every 2 weeks.  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  Current Activity/ Functional Status:   Zubrod Score: At the time of surgery  this patient's most appropriate activity status/level should be described as: []     0    Normal activity, no symptoms []     1    Restricted in physical strenuous activity but ambulatory, able to do out light work [x]     2    Ambulatory and capable of self care, unable to do work activities, up and about                 more than 50%  Of the time                            []     3    Only limited self care, in bed greater than 50% of waking hours []     4    Completely disabled, no self care, confined to bed or chair []     5    Moribund  Past Medical History:   Diagnosis Date   DM type 2 (diabetes mellitus, type 2) (HCC)    Hyperlipidemia    Tobacco abuse     Past Surgical History:  Procedure Laterality Date   RIGHT/LEFT HEART CATH AND CORONARY ANGIOGRAPHY N/A 04/26/2021   Procedure: RIGHT/LEFT HEART CATH AND CORONARY ANGIOGRAPHY;  Surgeon: , MD;  Location: [facility] INVASIVE CV LAB;  Service: Cardiovascular;  Laterality: N/A;   URETERAL STENT PLACEMENT      Social History   Tobacco Use  Smoking Status Every Day   Packs/day: 0.50   Types: Cigarettes  Smokeless Tobacco Never    Social History   Substance and Sexual Activity  Alcohol Use Yes   Comment: social drinker     Allergies  Allergen Reactions   Neosporin [Neomycin-Bacitracin Zn-Polymyx] Rash    Current Facility-Administered Medications  Medication Dose Route Frequency Provider Last Rate Last Admin   0.9 %  sodium chloride infusion  250 mL Intravenous PRN , MD       acetaminophen (TYLENOL) tablet 650 mg  650 mg Oral Q4H PRN , MD       aspirin EC tablet 81 mg  81 mg Oral Daily B, NP   81 mg at 04/28/21 0914   atorvastatin (LIPITOR) tablet 80 mg  80 mg Oral QHS 04/28/2021 M., PA-C   80 mg at 04/27/21 2210   insulin aspart (novoLOG) injection 0-5 Units  0-5 Units Subcutaneous QHS Kroeger, Krista M., PA-C       insulin aspart (novoLOG) injection 0-9 Units  0-9 Units Subcutaneous TID WC Kroeger, Krista M., PA-C   2 Units at 04/28/21 0900   ondansetron (ZOFRAN) injection 4 mg  4 mg Intravenous Q6H PRN 07-06-1988, MD       oxybutynin (DITROPAN) tablet 5 mg  5 mg Oral Daily PRN Tannenbaum, M, MD       sodium chloride flush (NS) 0.9 % injection 3 mL  3 mL Intravenous Q12H 09-14-1993, MD   3 mL at 04/28/21 1117   sodium chloride flush (NS) 0.9 % injection 3 mL  3 mL Intravenous PRN M, MD        Medications Prior to Admission  Medication Sig  Dispense Refill Last Dose   ascorbic acid (VITAMIN C) 100 MG tablet Take 100 mg by mouth daily.   04/24/2021   atorvastatin (LIPITOR) 10 MG tablet Take 10 mg by mouth daily.   04/24/2021   Cholecalciferol 25 MCG (  1000 UT) tablet Take 1,000 Units by mouth daily.   04/24/2021   cyanocobalamin 100 MCG tablet Take 100 mcg by mouth daily.   04/24/2021   metFORMIN (GLUCOPHAGE) 1000 MG tablet Take 1,000 mg by mouth 2 (two) times daily.   04/24/2021   nystatin cream (MYCOSTATIN) Apply 1 application topically 2 (two) times daily.   04/26/2021   oxybutynin (DITROPAN) 5 MG tablet Take 5 mg by mouth daily as needed for bladder spasms.   04/23/2021   sitaGLIPtin (JANUVIA) 100 MG tablet Take 100 mg by mouth daily.   04/24/2021    Family History  Problem Relation Age of Onset   Hyperlipidemia Father      Review of Systems:        Cardiac Review of Systems: Y or  [    ]= no  Chest Pain [    ]  Resting SOB [   ] Exertional SOB  [ x ]  Orthopnea [  ]   Pedal Edema [   ]    Palpitations [  ] Syncope  [ x ]   Presyncope [   ]  General Review of Systems: [Y] = yes [  ]=no Constitional: recent weight change [ x ]; anorexia [  ]; fatigue [  x]; nausea [  ]; night sweats [  ]; fever [  ]; or chills [  ]                                                               Dental: Full plate dentures top and bottom  Eye : blurred vision [  ]; diplopia [   ]; vision changes [  ];  Amaurosis fugax[  ]; Resp: cough [  ];  wheezing[  ];  hemoptysis[  ]; shortness of breath[  ]; paroxysmal nocturnal dyspnea[  ]; dyspnea on exertion[  ]; or orthopnea[  ];  GI:  gallstones[  ], vomiting[  ];  dysphagia[  ]; melena[  ];  hematochezia [  ]; heartburn[  ];   Hx of  Colonoscopy[  ]; GU: kidney stones [  ]; hematuria[  ];   dysuria [  ];  nocturia[  ];  history of     obstruction [ x ]; urinary frequency [  ]             Skin: rash, swelling[  ];, hair loss[  ];  peripheral edema[  ];  or itching[  ]; Musculosketetal: myalgias[  ];   joint swelling[  ];  joint erythema[  ];  joint pain[  ];  back pain[  ];  Heme/Lymph: bruising[  ];  bleeding[  ];  anemia[  ];  Neuro: TIA[  ];  headaches[  ];  stroke[  ];  vertigo[  ];  seizures[  ];   paresthesias[  ];  difficulty walking[  ];  Psych:depression[  ]; anxiety[  ];  Endocrine: diabetes[  ];  thyroid dysfunction[  ];                 Physical Exam: BP 121/61   Pulse (!) 57   Temp (!) 97.3 F (36.3 C) (Temporal)   Resp 19   Ht 6' (1.829 m)   Wt 68 kg   SpO2 97%  BMI 20.34 kg/m    General appearance: alert, cooperative, and no distress Head: Normocephalic, without obvious abnormality, atraumatic Neck: no adenopathy, no carotid bruit, no JVD, and supple, symmetrical, trachea midline Lymph nodes: No cervical or clavicular adenopathy Resp: clear to auscultation bilaterally Cardio: Regular rate and rhythm, there is a grade 3/6 to 4/6 systolic murmur GI: soft, non-tender; bowel sounds normal; no masses,  no organomegaly.  GU: There is a suprapubic catheter exiting the lower abdomen.  The surrounding tissues appear healthy. Extremities: No deformity, all are warm and well-perfused, no lower extremity varicosities Neurologic: Grossly normal  Diagnostic Studies & Laboratory data:   RIGHT/LEFT HEART CATH AND CORONARY ANGIOGRAPHY   Conclusion      Prox RCA to Mid RCA lesion is 100% stenosed.   Prox RCA lesion is 70% stenosed.   Prox Cx to Mid Cx lesion is 80% stenosed.   Mid LAD to Dist LAD lesion is 30% stenosed.   LV end diastolic pressure is normal.   Chronic total occlusion of the mid RCA. The mid and distal vessel fills from left to right collaterals.  Severe proximal Circumflex stenosis Mild mid LAD stenosis Large V waves suggestive of severe mitral regurgitation. (Echo with severe MR)   Recommendations: He will be admitted to our cardiology service. Continue workup for potential mitral valve surgery and CABG. He will likely need a TEE. Will ask CT  surgery to see him after the TEE.    Indications  Severe mitral regurgitation [I34.0 (ICD-10-CM)]   Procedural Details  Technical Details Indication: 70 yo male with history of HLD, DM, tobacco abuse admitted after a syncopal episode. He was found to have severe MR on echo.   Procedure: The risks, benefits, complications, treatment options, and expected outcomes were discussed with the patient. The patient and/or family concurred with the proposed plan, giving informed consent. The patient was brought to the cath lab after IV hydration was given. The patient was sedated with Versed and Fentanyl. The IV catheter in the right antecubital vein was changed for a 5 [place] sheath. Right heart catheterization performed with a balloon tipped catheter. The right wrist was prepped and draped in a sterile fashion. 1% lidocaine was used for local anesthesia. Using the modified Seldinger access technique, a 5 French sheath was placed in the right radial artery. 3 mg Verapamil was given through the sheath. 3000 units IV heparin was given. Standard diagnostic catheters were used to perform selective coronary angiography. The aortic valve was crossed with a JR4 catheter and a J wire. LV pressures measured. No LV gram. The sheath was removed from the right radial artery and a Terumo hemostasis band was applied at the arteriotomy site on the right wrist.      Estimated blood loss <50 mL.   During this procedure medications were administered to achieve and maintain moderate conscious sedation while the patient's heart rate, blood pressure, and oxygen saturation were continuously monitored and I was present face-to-face 100% of this time.   Medications (Filter: Administrations occurring from 1325 to 1421 on 04/26/21)  important  Continuous medications are totaled by the amount administered until 04/26/21 1421.    fentaNYL (SUBLIMAZE) injection (mcg) Total dose:  50 mcg  Date/Time Rate/Dose/Volume Action    04/26/21 1337 50 mcg Given    midazolam (VERSED) injection (mg) Total dose:  2 mg  Date/Time Rate/Dose/Volume Action   04/26/21 1337 2 mg Given    lidocaine (PF) (XYLOCAINE) 1 % injection (mL) Total volume:  4  mL  Date/Time Rate/Dose/Volume Action   04/26/21 1338 2 mL Given   1340 2 mL Given    Radial Cocktail/Verapamil only (mL) Total volume:  10 mL  Date/Time Rate/Dose/Volume Action   04/26/21 1344 10 mL Given    heparin sodium (porcine) injection (Units) Total dose:  3,000 Units  Date/Time Rate/Dose/Volume Action   04/26/21 1353 3,000 Units Given    Heparin (Porcine) in NaCl 1000-0.9 UT/500ML-% SOLN (mL) Total volume:  1,000 mL  Date/Time Rate/Dose/Volume Action   04/26/21 1359 500 mL Given   1359 500 mL Given    iohexol (OMNIPAQUE) 350 MG/ML injection (mL) Total volume:  60 mL  Date/Time Rate/Dose/Volume Action   04/26/21 1410 60 mL Given    Sedation Time  Sedation Time Physician-1: 27 minutes 50 seconds   Contrast  Medication Name Total Dose  iohexol (OMNIPAQUE) 350 MG/ML injection 60 mL    Radiation/Fluoro  Fluoro time: 5.5 (min) DAP: 11214 (mGycm2) Cumulative Air Kerma: 203 (mGy)   Coronary Findings   Diagnostic Dominance: Right  Left Anterior Descending  Vessel is large.  Mid LAD to Dist LAD lesion is 30% stenosed.  Left Circumflex  Vessel is large.  Prox Cx to Mid Cx lesion is 80% stenosed.  Right Coronary Artery  Vessel is large.  Prox RCA lesion is 70% stenosed.  Prox RCA to Mid RCA lesion is 100% stenosed. The lesion is chronically occluded.  Third Right Posterolateral Branch  Collaterals  3rd RPL filled by collaterals from 3rd Mrg.     Intervention   No interventions have been documented.  Right Heart  Right Heart Pressures LV EDP is normal.   Coronary Diagrams   Diagnostic Dominance: Right           Recent Radiology Findings:     I have independently reviewed the above radiologic studies and  discussed with the patient   Recent Lab Findings: Lab Results  Component Value Date   WBC 8.3 04/27/2021   HGB 12.5 (L) 04/27/2021   HCT 36.7 (L) 04/27/2021   PLT 229 04/27/2021   GLUCOSE 135 (H) 04/27/2021   CHOL 143 04/27/2021   TRIG 57 04/27/2021   HDL 47 04/27/2021   LDLCALC 85 04/27/2021   NA 130 (L) 04/27/2021   K 3.8 04/27/2021   CL 98 04/27/2021   CREATININE 0.78 04/27/2021   BUN 12 04/27/2021   CO2 24 04/27/2021      Assessment / Plan:    -Pleasant 62-year-old gentleman presented to [facility] ED 5 days ago following a syncopal episode and has been found to have severe two-vessel coronary artery disease and severe mitral insufficiency.  Transesophageal echo was done earlier today but the result is pending at this time.  He would certainly benefit from coronary revascularization and mitral valve repair versus replacement.  His history is concerning for unintentional weight loss of 30 pounds over the last 18 months.  It would be advisable for him to have a medical work-up to evaluate this and also urology evaluation to ensure the risk of infecting a prosthetic valve from the GU source is minimized. Dr. Hunt Zajicek will see Joseph Robles and review clinical data.  Recommendations regarding timing of surgery will follow up      I  spent 30 minutes counseling the patient face to face.   Myron G. Roddenberry, PA-C  04/28/2021 3:03 PM  Agree with above.  This is a 70-year-old gentleman was being evaluated for severe mitral valve insufficiency due to a   restricted posterior leaflet, and to vessel coronary artery disease.  He also has a history of a suprapubic catheter and the patient is edentulous.  He will need a urinalysis to ensure that he does not have a chronic urinary tract infection.  In regards to timing of surgery will need to speak with one of my partners as to have more availability next week for surgery.  The patient is agreeable to proceed.  I am optimistic  that we we will be able to get him scheduled sometime in the middle of next week.  Hershel Corkery Keane Scrape

## 2021-04-28 NOTE — Anesthesia Preprocedure Evaluation (Signed)
Anesthesia Evaluation  Patient identified by MRN, date of birth, ID band Patient awake    Reviewed: Allergy & Precautions, NPO status , Patient's Chart, lab work & pertinent test results  Airway Mallampati: II  TM Distance: >3 FB     Dental  (+) Dental Advisory Given   Pulmonary Current Smoker,    breath sounds clear to auscultation       Cardiovascular + CAD  + Valvular Problems/Murmurs MR  Rhythm:Regular Rate:Normal     Neuro/Psych negative neurological ROS     GI/Hepatic negative GI ROS, Neg liver ROS,   Endo/Other  diabetes, Type 2, Oral Hypoglycemic Agents  Renal/GU negative Renal ROS     Musculoskeletal   Abdominal   Peds  Hematology negative hematology ROS (+)   Anesthesia Other Findings   Reproductive/Obstetrics                             Anesthesia Physical Anesthesia Plan  ASA: 3  Anesthesia Plan: MAC   Post-op Pain Management:    Induction:   PONV Risk Score and Plan: Propofol infusion and Treatment may vary due to age or medical condition  Airway Management Planned: Natural Airway and Nasal Cannula  Additional Equipment:   Intra-op Plan:   Post-operative Plan:   Informed Consent: I have reviewed the patients History and Physical, chart, labs and discussed the procedure including the risks, benefits and alternatives for the proposed anesthesia with the patient or authorized representative who has indicated his/her understanding and acceptance.       Plan Discussed with:   Anesthesia Plan Comments:         Anesthesia Quick Evaluation

## 2021-04-28 NOTE — Consult Note (Addendum)
[address]       [place] [address]             [phone]        [name] [facility] Medical Record #[id number] Date of Birth: 01-09-1951  Referring: Corky Crafts, MD  Primary Care: None on file  primary Cardiologist: Dr. Tomie China  Reason for Consult: Surgical management of severe mitral insufficiency and two-vessel coronary artery  History of Present Illness:     Joseph Robles is a 70 year old male with past history significant for Dyslipidemia, type 2 diabetes mellitus, and tobacco use, and urethral stricture managed with a suprapubic catheter who presented to the [facility] 5 days ago following a syncopal episode.  He was in his usual state of health earlier that day when he began to feel strange while sitting in his living room.  He attempted to go outside t".  "He had some fresh air" but had a syncopal episode falling backwards and lacerating his head.  His wife witnessed the event and said he was unconscious for about 20 seconds.  He brought to [facility] via EMS.  His scalp laceration was repaired.  Further work-up for syncope included an EKG that showed sinus rhythm but no ST elevations or depressions.  Serum chemistries were unremarkable.  His BNP was 677, troponin was negative x3.  An echocardiogram obtained in the ED demonstrated an ejection fraction of 55 to 60%.  There was mild RV enlargement and moderate left atrial enlargement.  There was severe mitral insufficiency noted.  Joseph Robles also reports he had a chest x-ray and CT scan of his head while at [facility] but I am not able to find reports of these studies.     Joseph Robles was seen by Dr. Tomie China in consultation and transfer to [facility] for left heart catheterization was recommended.   Joseph Robles arrived to [facility] on 04/26/2021.  Left heart catheterization on that date demonstrated two-vessel coronary artery disease with a 70% proximal RCA  stenosis followed by mid total occlusion of the RCA.  There was left-to-right collaterals in place.  In addition, there was an 80% proximal circumflex stenosis before the first OM.  The LAD had a long 30% stenosis in its midportion.  Joseph Robles remained stable.  The cardiology service recommended further evaluation of the mitral valve with transesophageal echo.  That study occurred earlier today and results are pending.  CT surgery has been asked to evaluate Joseph Robles for consideration of coronary artery bypass grafting and mitral valve repair or replacement.  Joseph Robles has had no further symptoms since the syncopal episode on Monday of this week.  He does tell me, though, that he believes his health has been declining over the last year and a half.  This started with a febrile illness with associated nausea and vomiting that lasted 3 to 4 days.  Since then, he has had decreased appetite and a steady weight loss that he has totaled about 30 pounds.  Around the same time, he also developed a urethral stricture.  He has seen 3 different urologist for management this.  He currently has a suprapubic catheter in place that he says he gets obstructed fairly frequently.  He says he has to have this changed about every 2 weeks.  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  Current Activity/ Functional Status:   Zubrod Score: At the time of surgery  this patient's most appropriate activity status/level should be described as: []     0    Normal activity, no symptoms []     1    Restricted in physical strenuous activity but ambulatory, able to do out light work [x]     2    Ambulatory and capable of self care, unable to do work activities, up and about                 more than 50%  Of the time                            []     3    Only limited self care, in bed greater than 50% of waking hours []     4    Completely disabled, no self care, confined to bed or chair []     5    Moribund  Past Medical History:   Diagnosis Date   DM type 2 (diabetes mellitus, type 2) (HCC)    Hyperlipidemia    Tobacco abuse     Past Surgical History:  Procedure Laterality Date   RIGHT/LEFT HEART CATH AND CORONARY ANGIOGRAPHY N/A 04/26/2021   Procedure: RIGHT/LEFT HEART CATH AND CORONARY ANGIOGRAPHY;  Surgeon: , MD;  Location: [facility] INVASIVE CV LAB;  Service: Cardiovascular;  Laterality: N/A;   URETERAL STENT PLACEMENT      Social History   Tobacco Use  Smoking Status Every Day   Packs/day: 0.50   Types: Cigarettes  Smokeless Tobacco Never    Social History   Substance and Sexual Activity  Alcohol Use Yes   Comment: social drinker     Allergies  Allergen Reactions   Neosporin [Neomycin-Bacitracin Zn-Polymyx] Rash    Current Facility-Administered Medications  Medication Dose Route Frequency Provider Last Rate Last Admin   0.9 %  sodium chloride infusion  250 mL Intravenous PRN , MD       acetaminophen (TYLENOL) tablet 650 mg  650 mg Oral Q4H PRN , MD       aspirin EC tablet 81 mg  81 mg Oral Daily B, NP   81 mg at 04/28/21 0914   atorvastatin (LIPITOR) tablet 80 mg  80 mg Oral QHS 04/28/2021 M., PA-C   80 mg at 04/27/21 2210   insulin aspart (novoLOG) injection 0-5 Units  0-5 Units Subcutaneous QHS Kroeger, Krista M., PA-C       insulin aspart (novoLOG) injection 0-9 Units  0-9 Units Subcutaneous TID WC Kroeger, Krista M., PA-C   2 Units at 04/28/21 0900   ondansetron (ZOFRAN) injection 4 mg  4 mg Intravenous Q6H PRN 07-06-1988, MD       oxybutynin (DITROPAN) tablet 5 mg  5 mg Oral Daily PRN Tannenbaum, M, MD       sodium chloride flush (NS) 0.9 % injection 3 mL  3 mL Intravenous Q12H 09-14-1993, MD   3 mL at 04/28/21 1117   sodium chloride flush (NS) 0.9 % injection 3 mL  3 mL Intravenous PRN M, MD        Medications Prior to Admission  Medication Sig  Dispense Refill Last Dose   ascorbic acid (VITAMIN C) 100 MG tablet Take 100 mg by mouth daily.   04/24/2021   atorvastatin (LIPITOR) 10 MG tablet Take 10 mg by mouth daily.   04/24/2021   Cholecalciferol 25 MCG (  1000 UT) tablet Take 1,000 Units by mouth daily.   04/24/2021   cyanocobalamin 100 MCG tablet Take 100 mcg by mouth daily.   04/24/2021   metFORMIN (GLUCOPHAGE) 1000 MG tablet Take 1,000 mg by mouth 2 (two) times daily.   04/24/2021   nystatin cream (MYCOSTATIN) Apply 1 application topically 2 (two) times daily.   04/26/2021   oxybutynin (DITROPAN) 5 MG tablet Take 5 mg by mouth daily as needed for bladder spasms.   04/23/2021   sitaGLIPtin (JANUVIA) 100 MG tablet Take 100 mg by mouth daily.   04/24/2021    Family History  Problem Relation Age of Onset   Hyperlipidemia Father      Review of Systems:        Cardiac Review of Systems: Y or  [    ]= no  Chest Pain [    ]  Resting SOB [   ] Exertional SOB  [ x ]  Orthopnea [  ]   Pedal Edema [   ]    Palpitations [  ] Syncope  [ x ]   Presyncope [   ]  General Review of Systems: [Y] = yes [  ]=no Constitional: recent weight change [ x ]; anorexia [  ]; fatigue [  x]; nausea [  ]; night sweats [  ]; fever [  ]; or chills [  ]                                                               Dental: Full plate dentures top and bottom  Eye : blurred vision [  ]; diplopia [   ]; vision changes [  ];  Amaurosis fugax[  ]; Resp: cough [  ];  wheezing[  ];  hemoptysis[  ]; shortness of breath[  ]; paroxysmal nocturnal dyspnea[  ]; dyspnea on exertion[  ]; or orthopnea[  ];  GI:  gallstones[  ], vomiting[  ];  dysphagia[  ]; melena[  ];  hematochezia [  ]; heartburn[  ];   Hx of  Colonoscopy[  ]; GU: kidney stones [  ]; hematuria[  ];   dysuria [  ];  nocturia[  ];  history of     obstruction [ x ]; urinary frequency [  ]             Skin: rash, swelling[  ];, hair loss[  ];  peripheral edema[  ];  or itching[  ]; Musculosketetal: myalgias[  ];   joint swelling[  ];  joint erythema[  ];  joint pain[  ];  back pain[  ];  Heme/Lymph: bruising[  ];  bleeding[  ];  anemia[  ];  Neuro: TIA[  ];  headaches[  ];  stroke[  ];  vertigo[  ];  seizures[  ];   paresthesias[  ];  difficulty walking[  ];  Psych:depression[  ]; anxiety[  ];  Endocrine: diabetes[  ];  thyroid dysfunction[  ];                 Physical Exam: BP 121/61   Pulse (!) 57   Temp (!) 97.3 F (36.3 C) (Temporal)   Resp 19   Ht 6' (1.829 m)   Wt 68 kg   SpO2 97%  BMI 20.34 kg/m    General appearance: alert, cooperative, and no distress Head: Normocephalic, without obvious abnormality, atraumatic Neck: no adenopathy, no carotid bruit, no JVD, and supple, symmetrical, trachea midline Lymph nodes: No cervical or clavicular adenopathy Resp: clear to auscultation bilaterally Cardio: Regular rate and rhythm, there is a grade 3/6 to 4/6 systolic murmur GI: soft, non-tender; bowel sounds normal; no masses,  no organomegaly.  GU: There is a suprapubic catheter exiting the lower abdomen.  The surrounding tissues appear healthy. Extremities: No deformity, all are warm and well-perfused, no lower extremity varicosities Neurologic: Grossly normal  Diagnostic Studies & Laboratory Robles:   RIGHT/LEFT HEART CATH AND CORONARY ANGIOGRAPHY   Conclusion      Prox RCA to Mid RCA lesion is 100% stenosed.   Prox RCA lesion is 70% stenosed.   Prox Cx to Mid Cx lesion is 80% stenosed.   Mid LAD to Dist LAD lesion is 30% stenosed.   LV end diastolic pressure is normal.   Chronic total occlusion of the mid RCA. The mid and distal vessel fills from left to right collaterals.  Severe proximal Circumflex stenosis Mild mid LAD stenosis Large V waves suggestive of severe mitral regurgitation. (Echo with severe MR)   Recommendations: He will be admitted to our cardiology service. Continue workup for potential mitral valve surgery and CABG. He will likely need a TEE. Will ask CT  surgery to see him after the TEE.    Indications  Severe mitral regurgitation [I34.0 (ICD-10-CM)]   Procedural Details  Technical Details Indication: 70 yo male with history of HLD, DM, tobacco abuse admitted after a syncopal episode. He was found to have severe MR on echo.   Procedure: The risks, benefits, complications, treatment options, and expected outcomes were discussed with the patient. The patient and/or family concurred with the proposed plan, giving informed consent. The patient was brought to the cath lab after IV hydration was given. The patient was sedated with Versed and Fentanyl. The IV catheter in the right antecubital vein was changed for a 5 [place] sheath. Right heart catheterization performed with a balloon tipped catheter. The right wrist was prepped and draped in a sterile fashion. 1% lidocaine was used for local anesthesia. Using the modified Seldinger access technique, a 5 French sheath was placed in the right radial artery. 3 mg Verapamil was given through the sheath. 3000 units IV heparin was given. Standard diagnostic catheters were used to perform selective coronary angiography. The aortic valve was crossed with a JR4 catheter and a J wire. LV pressures measured. No LV gram. The sheath was removed from the right radial artery and a Terumo hemostasis band was applied at the arteriotomy site on the right wrist.      Estimated blood loss <50 mL.   During this procedure medications were administered to achieve and maintain moderate conscious sedation while the patient's heart rate, blood pressure, and oxygen saturation were continuously monitored and I was present face-to-face 100% of this time.   Medications (Filter: Administrations occurring from 1325 to 1421 on 04/26/21)  important  Continuous medications are totaled by the amount administered until 04/26/21 1421.    fentaNYL (SUBLIMAZE) injection (mcg) Total dose:  50 mcg  Date/Time Rate/Dose/Volume Action    04/26/21 1337 50 mcg Given    midazolam (VERSED) injection (mg) Total dose:  2 mg  Date/Time Rate/Dose/Volume Action   04/26/21 1337 2 mg Given    lidocaine (PF) (XYLOCAINE) 1 % injection (mL) Total volume:  4  mL  Date/Time Rate/Dose/Volume Action   04/26/21 1338 2 mL Given   1340 2 mL Given    Radial Cocktail/Verapamil only (mL) Total volume:  10 mL  Date/Time Rate/Dose/Volume Action   04/26/21 1344 10 mL Given    heparin sodium (porcine) injection (Units) Total dose:  3,000 Units  Date/Time Rate/Dose/Volume Action   04/26/21 1353 3,000 Units Given    Heparin (Porcine) in NaCl 1000-0.9 UT/500ML-% SOLN (mL) Total volume:  1,000 mL  Date/Time Rate/Dose/Volume Action   04/26/21 1359 500 mL Given   1359 500 mL Given    iohexol (OMNIPAQUE) 350 MG/ML injection (mL) Total volume:  60 mL  Date/Time Rate/Dose/Volume Action   04/26/21 1410 60 mL Given    Sedation Time  Sedation Time Physician-1: 27 minutes 50 seconds   Contrast  Medication Name Total Dose  iohexol (OMNIPAQUE) 350 MG/ML injection 60 mL    Radiation/Fluoro  Fluoro time: 5.5 (min) DAP: 11214 (mGycm2) Cumulative Air Kerma: 203 (mGy)   Coronary Findings   Diagnostic Dominance: Right  Left Anterior Descending  Vessel is large.  Mid LAD to Dist LAD lesion is 30% stenosed.  Left Circumflex  Vessel is large.  Prox Cx to Mid Cx lesion is 80% stenosed.  Right Coronary Artery  Vessel is large.  Prox RCA lesion is 70% stenosed.  Prox RCA to Mid RCA lesion is 100% stenosed. The lesion is chronically occluded.  Third Right Posterolateral Branch  Collaterals  3rd RPL filled by collaterals from 3rd Mrg.     Intervention   No interventions have been documented.  Right Heart  Right Heart Pressures LV EDP is normal.   Coronary Diagrams   Diagnostic Dominance: Right           Recent Radiology Findings:     I have independently reviewed the above radiologic studies and  discussed with the patient   Recent Lab Findings: Lab Results  Component Value Date   WBC 8.3 04/27/2021   HGB 12.5 (L) 04/27/2021   HCT 36.7 (L) 04/27/2021   PLT 229 04/27/2021   GLUCOSE 135 (H) 04/27/2021   CHOL 143 04/27/2021   TRIG 57 04/27/2021   HDL 47 04/27/2021   LDLCALC 85 04/27/2021   NA 130 (L) 04/27/2021   K 3.8 04/27/2021   CL 98 04/27/2021   CREATININE 0.78 04/27/2021   BUN 12 04/27/2021   CO2 24 04/27/2021      Assessment / Plan:    -Pleasant 70 year old gentleman presented to [facility] ED 5 days ago following a syncopal episode and has been found to have severe two-vessel coronary artery disease and severe mitral insufficiency.  Transesophageal echo was done earlier today but the result is pending at this time.  He would certainly benefit from coronary revascularization and mitral valve repair versus replacement.  His history is concerning for unintentional weight loss of 30 pounds over the last 18 months.  It would be advisable for him to have a medical work-up to evaluate this and also urology evaluation to ensure the risk of infecting a prosthetic valve from the GU source is minimized. Dr. Cliffton Asters will see Joseph Robles.  Recommendations regarding timing of surgery will follow up      I  spent 30 minutes counseling the patient face to face.   Leary Roca, PA-C  04/28/2021 3:03 PM  Agree with above.  This is a 70 year old gentleman was being evaluated for severe mitral valve insufficiency due to a  restricted posterior leaflet, and to vessel coronary artery disease.  He also has a history of a suprapubic catheter and the patient is edentulous.  He will need a urinalysis to ensure that he does not have a chronic urinary tract infection.  In regards to timing of surgery will need to speak with one of my partners as to have more availability next week for surgery.  The patient is agreeable to proceed.  I am optimistic  that we we will be able to get him scheduled sometime in the middle of next week.  Draper Gallon Keane Scrape

## 2021-04-28 NOTE — Transfer of Care (Signed)
Immediate Anesthesia Transfer of Care Note  Patient: Joseph Robles  Procedure(s) Performed: TRANSESOPHAGEAL ECHOCARDIOGRAM (TEE)  Patient Location: Endoscopy Unit  Anesthesia Type:MAC  Level of Consciousness: awake and drowsy  Airway & Oxygen Therapy: Patient Spontanous Breathing  Post-op Assessment: Report given to RN, Post -op Vital signs reviewed and stable and Patient moving all extremities X 4  Post vital signs: Reviewed and stable  Last Vitals:  Vitals Value Taken Time  BP 103/59 04/28/21 1414  Temp    Pulse 66 04/28/21 1416  Resp 13 04/28/21 1416  SpO2 97 % 04/28/21 1416  Vitals shown include unvalidated device data.  Last Pain:  Vitals:   04/28/21 1239  TempSrc: Temporal  PainSc: 0-No pain      Patients Stated Pain Goal: 0 (53/61/44 3154)  Complications: No notable events documented.

## 2021-04-28 NOTE — Interval H&P Note (Signed)
History and Physical Interval Note:  04/28/2021 1:27 PM  Joseph Robles  has presented today for surgery, with the diagnosis of Mitral Valve.  The various methods of treatment have been discussed with the patient and family. After consideration of risks, benefits and other options for treatment, the patient has consented to  Procedure(s): TRANSESOPHAGEAL ECHOCARDIOGRAM (TEE) (N/A) as a surgical intervention.  The patient's history has been reviewed, patient examined, no change in status, stable for surgery.  I have reviewed the patient's chart and labs.  Questions were answered to the patient's satisfaction.     Dietrich Pates

## 2021-04-28 NOTE — Progress Notes (Addendum)
 Progress Note  Patient Name: Joseph Robles Date of Encounter: 04/28/2021  [facility] Cardiologist: None   Subjective   No complaints this morning. Planned for TEE today  Inpatient Medications    Scheduled Meds:  aspirin EC  81 mg Oral Daily   atorvastatin  80 mg Oral QHS   insulin aspart  0-5 Units Subcutaneous QHS   insulin aspart  0-9 Units Subcutaneous TID WC   sodium chloride flush  3 mL Intravenous Q12H   Continuous Infusions:  sodium chloride     PRN Meds: sodium chloride, acetaminophen, ondansetron (ZOFRAN) IV, oxybutynin, sodium chloride flush   Vital Signs    Vitals:   04/27/21 1704 04/27/21 2207 04/28/21 0606 04/28/21 0910  BP: 121/79 120/80 115/71 137/79  Pulse: 67 77 67 64  Resp: 18 18 18   Temp:  98.5 F (36.9 C) 98 F (36.7 C) 97.9 F (36.6 C)  TempSrc:  Oral Oral Oral  SpO2: 97% 96% 98%     Intake/Output Summary (Last 24 hours) at 04/28/2021 0940 Last data filed at 04/28/2021 0606 Gross per 24 hour  Intake --  Output 1800 ml  Net -1800 ml   No flowsheet data found.    Telemetry    SR - Personally Reviewed  ECG    No new tracing  Physical Exam   GEN: No acute distress.   Neck: No JVD Cardiac: RRR, 3/6 systolic murmur LLSB, no rubs, or gallops.  Respiratory: Clear to auscultation bilaterally. GI: Soft, nontender, non-distended  MS: No edema; No deformity. Neuro:  Nonfocal  Psych: Normal affect   Labs    High Sensitivity Troponin:  No results for input(s): TROPONINIHS in the last 720 hours.   Chemistry Recent Labs  Lab 04/26/21 1351 04/26/21 1353 04/27/21 0224  NA 138 140 130*  K 3.7 3.5 3.8  CL  --   --  98  CO2  --   --  24  GLUCOSE  --   --  135*  BUN  --   --  12  CREATININE  --   --  0.78  CALCIUM  --   --  8.8*  GFRNONAA  --   --  >60  ANIONGAP  --   --  8    Lipids  Recent Labs  Lab 04/27/21 0224  CHOL 143  TRIG 57  HDL 47  LDLCALC 85  CHOLHDL 3.0    Hematology Recent Labs  Lab 04/26/21 1351  04/26/21 1353 04/27/21 0224  WBC  --   --  8.3  RBC  --   --  4.21*  HGB 12.6* 11.9* 12.5*  HCT 37.0* 35.0* 36.7*  MCV  --   --  87.2  MCH  --   --  29.7  MCHC  --   --  34.1  RDW  --   --  14.2  PLT  --   --  229   Thyroid No results for input(s): TSH, FREET4 in the last 168 hours.  BNPNo results for input(s): BNP, PROBNP in the last 168 hours.  DDimer No results for input(s): DDIMER in the last 168 hours.   Radiology    CARDIAC CATHETERIZATION  Addendum Date: 04/26/2021     Prox RCA to Mid RCA lesion is 100% stenosed.   Prox RCA lesion is 70% stenosed.   Prox Cx to Mid Cx lesion is 80% stenosed.   Mid LAD to Dist LAD lesion is 30% stenosed.   LV end diastolic   pressure is normal. Chronic total occlusion of the mid RCA. The mid and distal vessel fills from left to right collaterals. Severe proximal Circumflex stenosis Mild mid LAD stenosis Large V waves suggestive of severe mitral regurgitation. (Echo with severe MR) Recommendations: He will be admitted to our cardiology service. Continue workup for potential mitral valve surgery and CABG. He will likely need a TEE. Will ask CT surgery to see him after the TEE.   Result Date: 04/26/2021   Prox RCA to Mid RCA lesion is 100% stenosed.   Prox RCA lesion is 70% stenosed.   Prox Cx to Mid Cx lesion is 80% stenosed.   Mid LAD to Dist LAD lesion is 30% stenosed.   LV end diastolic pressure is normal. Chronic total occlusion of the mid RCA. The mid and distal vessel fills from left to right collaterals. Severe proximal Circumflex stenosis Mild mid LAD stenosis Large V waves suggestive of severe mitral regurgitation. (Echo with severe MR) Recommendations: He will be admitted to our cardiology service. Continue workup for potential mitral valve surgery and CABG. He will likely need a TEE. Will ask CT surgery to see him after the TEE.    Cardiac Studies   Cath: 04/26/21   Prox RCA to Mid RCA lesion is 100% stenosed.   Prox RCA lesion is 70%  stenosed.   Prox Cx to Mid Cx lesion is 80% stenosed.   Mid LAD to Dist LAD lesion is 30% stenosed.   LV end diastolic pressure is normal.   Chronic total occlusion of the mid RCA. The mid and distal vessel fills from left to right collaterals.  Severe proximal Circumflex stenosis Mild mid LAD stenosis Large V waves suggestive of severe mitral regurgitation. (Echo with severe MR)   Recommendations: He will be admitted to our cardiology service. Continue workup for potential mitral valve surgery and CABG. He will likely need a TEE. Will ask CT surgery to see him after the TEE.    Diagnostic Dominance: Right   Patient Profile     70 y.o. male with PMH HLD, DM type II, and tobacco abuse who is being seen 04/26/2021 for the evaluation of severe mitral regurgitation.  Assessment & Plan    Severe mitral regurgitation: Patient presented after a syncopal episode resulting in laceration to posterior head.  He had no prodromal chest pain, palpitations, diaphoresis, nausea, vomiting, shortness of breath.  Echocardiogram at [facility] showed EF 55-60%, basilar inferior akinesis with aneurysmal wall, and severe MR.  He was transferred to [facility] for further work up. Cath noted above, large V waves suggestive of severe mitral regurg.  -- was able to to get on TEE schedule for 1pm today. Has been NPO  Shared Decision Making/Informed Consent{ The risks [esophageal damage, perforation (1:10,000 risk), bleeding, pharyngeal hematoma as well as other potential complications associated with conscious sedation including aspiration, arrhythmia, respiratory failure and death], benefits (treatment guidance and diagnostic support) and alternatives of a transesophageal echocardiogram were discussed in detail with Joseph Robles and he is willing to proceed.    CAD: Underwent cardiac cath noted above with pRCA 70% mRCA 100% with left to right collaterals, pLcx 80%. Recommendations for TCTS consult for CABG +  mitral valve surgery.  -- on statin, ASA  Syncope: As above, presented with syncope and head strike.  No significant prodromal symptoms.  EKG was nonischemic.  Troponins negative x3.  Echo with EF 55-60%, +wall motion abnormality.  No arrhythmias noted on telemetry at [facility].  He was transferred to [place] for cardiac catheterization noted above.  -- follow telemetry  HLD: LDL 85 -- increased atorvastatin to 80mg daily on admission -- FLP/LFTs in 8 weeks   DM type 2: A1c 7.0 at [facility]. On metformin and januvia at home -- Continue SSI this admission   Scalp laceration: 6 sutures to posterior scalp placed  -- removal 7-10 days after placement - evaluate readiness 05/02/21  For questions or updates, please contact [facility] Please consult [email or web address] for contact info under        Signed, Lindsay Roberts, NP  04/28/2021, 9:40 AM    I have examined the patient and reviewed assessment and plan and discussed with patient.  Agree with above as stated.    No pain on back of head from fall.  Plan for TEE today.  Will likely need CABG with MV repair.  Further plans based on TEE results.   Joseph Robles  

## 2021-04-28 NOTE — Anesthesia Postprocedure Evaluation (Signed)
Anesthesia Post Note  Patient: Joseph Robles  Procedure(s) Performed: TRANSESOPHAGEAL ECHOCARDIOGRAM (TEE)     Patient location during evaluation: PACU Anesthesia Type: MAC Level of consciousness: awake and alert Pain management: pain level controlled Vital Signs Assessment: post-procedure vital signs reviewed and stable Respiratory status: spontaneous breathing, nonlabored ventilation, respiratory function stable and patient connected to nasal cannula oxygen Cardiovascular status: stable and blood pressure returned to baseline Postop Assessment: no apparent nausea or vomiting Anesthetic complications: no   No notable events documented.  Last Vitals:  Vitals:   04/28/21 1425 04/28/21 1435  BP: 121/68 121/61  Pulse: 61 (!) 57  Resp: (!) 22 19  Temp:    SpO2: 96% 97%    Last Pain:  Vitals:   04/28/21 1435  TempSrc:   PainSc: 0-No pain                 Effie Berkshire

## 2021-04-28 NOTE — H&P (View-Only) (Signed)
Progress Note  Patient Name: Joseph Robles Date of Encounter: 04/28/2021  [facility] Cardiologist: None   Subjective   No complaints this morning. Planned for TEE today  Inpatient Medications    Scheduled Meds:  aspirin EC  81 mg Oral Daily   atorvastatin  80 mg Oral QHS   insulin aspart  0-5 Units Subcutaneous QHS   insulin aspart  0-9 Units Subcutaneous TID WC   sodium chloride flush  3 mL Intravenous Q12H   Continuous Infusions:  sodium chloride     PRN Meds: sodium chloride, acetaminophen, ondansetron (ZOFRAN) IV, oxybutynin, sodium chloride flush   Vital Signs    Vitals:   04/27/21 1704 04/27/21 2207 04/28/21 0606 04/28/21 0910  BP: 121/79 120/80 115/71 137/79  Pulse: 67 77 67 64  Resp: 18 18 18    Temp:  98.5 F (36.9 C) 98 F (36.7 C) 97.9 F (36.6 C)  TempSrc:  Oral Oral Oral  SpO2: 97% 96% 98%     Intake/Output Summary (Last 24 hours) at 04/28/2021 0940 Last data filed at 04/28/2021 0606 Gross per 24 hour  Intake --  Output 1800 ml  Net -1800 ml   No flowsheet data found.    Telemetry    SR - Personally Reviewed  ECG    No new tracing  Physical Exam   GEN: No acute distress.   Neck: No JVD Cardiac: RRR, 3/6 systolic murmur LLSB, no rubs, or gallops.  Respiratory: Clear to auscultation bilaterally. GI: Soft, nontender, non-distended  MS: No edema; No deformity. Neuro:  Nonfocal  Psych: Normal affect   Labs    High Sensitivity Troponin:  No results for input(s): TROPONINIHS in the last 720 hours.   Chemistry Recent Labs  Lab 04/26/21 1351 04/26/21 1353 04/27/21 0224  NA 138 140 130*  K 3.7 3.5 3.8  CL  --   --  98  CO2  --   --  24  GLUCOSE  --   --  135*  BUN  --   --  12  CREATININE  --   --  0.78  CALCIUM  --   --  8.8*  GFRNONAA  --   --  >60  ANIONGAP  --   --  8    Lipids  Recent Labs  Lab 04/27/21 0224  CHOL 143  TRIG 57  HDL 47  LDLCALC 85  CHOLHDL 3.0    Hematology Recent Labs  Lab 04/26/21 1351  04/26/21 1353 04/27/21 0224  WBC  --   --  8.3  RBC  --   --  4.21*  HGB 12.6* 11.9* 12.5*  HCT 37.0* 35.0* 36.7*  MCV  --   --  87.2  MCH  --   --  29.7  MCHC  --   --  34.1  RDW  --   --  14.2  PLT  --   --  229   Thyroid No results for input(s): TSH, FREET4 in the last 168 hours.  BNPNo results for input(s): BNP, PROBNP in the last 168 hours.  DDimer No results for input(s): DDIMER in the last 168 hours.   Radiology    CARDIAC CATHETERIZATION  Addendum Date: 04/26/2021     Prox RCA to Mid RCA lesion is 100% stenosed.   Prox RCA lesion is 70% stenosed.   Prox Cx to Mid Cx lesion is 80% stenosed.   Mid LAD to Dist LAD lesion is 30% stenosed.   LV end diastolic  pressure is normal. Chronic total occlusion of the mid RCA. The mid and distal vessel fills from left to right collaterals. Severe proximal Circumflex stenosis Mild mid LAD stenosis Large V waves suggestive of severe mitral regurgitation. (Echo with severe MR) Recommendations: He will be admitted to our cardiology service. Continue workup for potential mitral valve surgery and CABG. He will likely need a TEE. Will ask CT surgery to see him after the TEE.   Result Date: 04/26/2021   Prox RCA to Mid RCA lesion is 100% stenosed.   Prox RCA lesion is 70% stenosed.   Prox Cx to Mid Cx lesion is 80% stenosed.   Mid LAD to Dist LAD lesion is 30% stenosed.   LV end diastolic pressure is normal. Chronic total occlusion of the mid RCA. The mid and distal vessel fills from left to right collaterals. Severe proximal Circumflex stenosis Mild mid LAD stenosis Large V waves suggestive of severe mitral regurgitation. (Echo with severe MR) Recommendations: He will be admitted to our cardiology service. Continue workup for potential mitral valve surgery and CABG. He will likely need a TEE. Will ask CT surgery to see him after the TEE.    Cardiac Studies   Cath: 04/26/21   Prox RCA to Mid RCA lesion is 100% stenosed.   Prox RCA lesion is 70%  stenosed.   Prox Cx to Mid Cx lesion is 80% stenosed.   Mid LAD to Dist LAD lesion is 30% stenosed.   LV end diastolic pressure is normal.   Chronic total occlusion of the mid RCA. The mid and distal vessel fills from left to right collaterals.  Severe proximal Circumflex stenosis Mild mid LAD stenosis Large V waves suggestive of severe mitral regurgitation. (Echo with severe MR)   Recommendations: He will be admitted to our cardiology service. Continue workup for potential mitral valve surgery and CABG. He will likely need a TEE. Will ask CT surgery to see him after the TEE.    Diagnostic Dominance: Right   Patient Profile     70 y.o. male with PMH HLD, DM type II, and tobacco abuse who is being seen 04/26/2021 for the evaluation of severe mitral regurgitation.  Assessment & Plan    Severe mitral regurgitation: Patient presented after a syncopal episode resulting in laceration to posterior head.  He had no prodromal chest pain, palpitations, diaphoresis, nausea, vomiting, shortness of breath.  Echocardiogram at [facility] showed EF 55-60%, basilar inferior akinesis with aneurysmal wall, and severe MR.  He was transferred to [facility] for further work up. Cath noted above, large V waves suggestive of severe mitral regurg.  -- was able to to get on TEE schedule for 1pm today. Has been NPO  Shared Decision Making/Informed Consent{ The risks [esophageal damage, perforation (1:10,000 risk), bleeding, pharyngeal hematoma as well as other potential complications associated with conscious sedation including aspiration, arrhythmia, respiratory failure and death], benefits (treatment guidance and diagnostic support) and alternatives of a transesophageal echocardiogram were discussed in detail with Mr. Brannan and he is willing to proceed.    CAD: Underwent cardiac cath noted above with pRCA 70% mRCA 100% with left to right collaterals, pLcx 80%. Recommendations for TCTS consult for CABG +  mitral valve surgery.  -- on statin, ASA  Syncope: As above, presented with syncope and head strike.  No significant prodromal symptoms.  EKG was nonischemic.  Troponins negative x3.  Echo with EF 55-60%, +wall motion abnormality.  No arrhythmias noted on telemetry at [facility].  He was transferred to [facility] for cardiac catheterization noted above.  -- follow telemetry  HLD: LDL 85 -- increased atorvastatin to 80mg  daily on admission -- FLP/LFTs in 8 weeks   DM type 2: A1c 7.0 at [facility]. On metformin and januvia at home -- Continue SSI this admission   Scalp laceration: 6 sutures to posterior scalp placed  -- removal 7-10 days after placement - evaluate readiness 05/02/21  For questions or updates, please contact [facility] Please consult [email or web address] for contact info under        Signed, 05/04/21, NP  04/28/2021, 9:40 AM    I have examined the patient and reviewed assessment and plan and discussed with patient.  Agree with above as stated.    No pain on back of head from fall.  Plan for TEE today.  Will likely need CABG with MV repair.  Further plans based on TEE results.   04/30/2021

## 2021-04-29 DIAGNOSIS — I340 Nonrheumatic mitral (valve) insufficiency: Secondary | ICD-10-CM | POA: Diagnosis not present

## 2021-04-29 DIAGNOSIS — R55 Syncope and collapse: Secondary | ICD-10-CM | POA: Diagnosis not present

## 2021-04-29 LAB — URINALYSIS, ROUTINE W REFLEX MICROSCOPIC
Bilirubin Urine: NEGATIVE
Glucose, UA: NEGATIVE mg/dL
Ketones, ur: NEGATIVE mg/dL
Nitrite: NEGATIVE
Protein, ur: NEGATIVE mg/dL
Specific Gravity, Urine: 1.019 (ref 1.005–1.030)
WBC, UA: >50 WBC/hpf — ABNORMAL HIGH (ref 0–5)
pH: 7 (ref 5.0–8.0)

## 2021-04-29 LAB — BASIC METABOLIC PANEL
Anion gap: 6 (ref 5–15)
BUN: 15 mg/dL (ref 8–23)
CO2: 29 mmol/L (ref 22–32)
Calcium: 9.2 mg/dL (ref 8.9–10.3)
Chloride: 99 mmol/L (ref 98–111)
Creatinine, Ser: 0.89 mg/dL (ref 0.61–1.24)
GFR, Estimated: >60 mL/min (ref >60)
Glucose, Bld: 101 mg/dL — ABNORMAL HIGH (ref 70–99)
Potassium: 4.1 mmol/L (ref 3.5–5.1)
Sodium: 134 mmol/L — ABNORMAL LOW (ref 135–145)

## 2021-04-29 LAB — CBC
HCT: 38.9 % — ABNORMAL LOW (ref 39.0–52.0)
Hemoglobin: 12.9 g/dL — ABNORMAL LOW (ref 13.0–17.0)
MCH: 29.2 pg (ref 26.0–34.0)
MCHC: 33.2 g/dL (ref 30.0–36.0)
MCV: 88 fL (ref 80.0–100.0)
Platelets: 230 10*3/uL (ref 150–400)
RBC: 4.42 MIL/uL (ref 4.22–5.81)
RDW: 13.9 % (ref 11.5–15.5)
WBC: 6.5 10*3/uL (ref 4.0–10.5)
nRBC: 0 % (ref 0.0–0.2)

## 2021-04-29 LAB — GLUCOSE, CAPILLARY
Glucose-Capillary: 146 mg/dL — ABNORMAL HIGH (ref 70–99)
Glucose-Capillary: 221 mg/dL — ABNORMAL HIGH (ref 70–99)
Glucose-Capillary: 172 mg/dL — ABNORMAL HIGH (ref 70–99)
Glucose-Capillary: 157 mg/dL — ABNORMAL HIGH (ref 70–99)
Glucose-Capillary: 158 mg/dL — ABNORMAL HIGH (ref 70–99)

## 2021-04-29 NOTE — Progress Notes (Signed)
Progress Note  Patient Name: Joseph Robles Date of Encounter: 04/29/2021  [facility] Cardiologist: None   Subjective   NAEO. Seen by CTS PA yesterday.   Inpatient Medications    Scheduled Meds:  aspirin EC  81 mg Oral Daily   atorvastatin  80 mg Oral QHS   insulin aspart  0-5 Units Subcutaneous QHS   insulin aspart  0-9 Units Subcutaneous TID WC   sodium chloride flush  3 mL Intravenous Q12H   Continuous Infusions:  sodium chloride     PRN Meds: sodium chloride, acetaminophen, ondansetron (ZOFRAN) IV, oxybutynin, sodium chloride flush   Vital Signs    Vitals:   04/28/21 2011 04/29/21 0001 04/29/21 0451 04/29/21 0845  BP: 126/77 112/68 109/70 117/78  Pulse: 70 70 65 62  Resp: 20 16 19 18   Temp: 97.9 F (36.6 C) 97.9 F (36.6 C) 97.9 F (36.6 C)   TempSrc: Oral Oral Oral   SpO2: 98% 96% 97% 98%  Weight:      Height:        Intake/Output Summary (Last 24 hours) at 04/29/2021 1214 Last data filed at 04/29/2021 0451 Gross per 24 hour  Intake 133.53 ml  Output 1550 ml  Net -1416.47 ml   Last 3 Weights 04/28/2021  Weight (lbs) 150 lb  Weight (kg) 68.04 kg      Telemetry    No significant arrhythmias. Personally Reviewed  ECG    Personally Reviewed  Physical Exam   GEN: No acute distress.   Neck: No JVD Cardiac: RRR, no murmurs, rubs, or gallops.  Respiratory: Clear to auscultation bilaterally. GI: Soft, nontender, non-distended  MS: No edema; No deformity. Neuro:  Nonfocal  Psych: Normal affect   Labs    High Sensitivity Troponin:  No results for input(s): TROPONINIHS in the last 720 hours.   Chemistry Recent Labs  Lab 04/26/21 1353 04/27/21 0224 04/29/21 0250  NA 140 130* 134*  K 3.5 3.8 4.1  CL  --  98 99  CO2  --  24 29  GLUCOSE  --  135* 101*  BUN  --  12 15  CREATININE  --  0.78 0.89  CALCIUM  --  8.8* 9.2  GFRNONAA  --  >60 >60  ANIONGAP  --  8 6    Lipids  Recent Labs  Lab 04/27/21 0224  CHOL 143  TRIG 57  HDL 47   LDLCALC 85  CHOLHDL 3.0    Hematology Recent Labs  Lab 04/26/21 1353 04/27/21 0224 04/29/21 0250  WBC  --  8.3 6.5  RBC  --  4.21* 4.42  HGB 11.9* 12.5* 12.9*  HCT 35.0* 36.7* 38.9*  MCV  --  87.2 88.0  MCH  --  29.7 29.2  MCHC  --  34.1 33.2  RDW  --  14.2 13.9  PLT  --  229 230   Thyroid No results for input(s): TSH, FREET4 in the last 168 hours.  BNPNo results for input(s): BNP, PROBNP in the last 168 hours.  DDimer No results for input(s): DDIMER in the last 168 hours.   Radiology    ECHO TEE  Result Date: 04/28/2021    TRANSESOPHOGEAL ECHO REPORT   Patient Name:   Joseph Robles Date of Exam: 04/28/2021 Medical Rec #:  04/30/2021   Height:       72.0 in Accession #:    [id number]  Weight:       150.0 lb Date of Birth:  23-Aug-1950  BSA:          1.885 m Patient Age:    70 years    BP:           134/75 mmHg Patient Gender: M           HR:           77 bpm. Exam Location:  Inpatient Procedure: Cardiac Doppler, Color Doppler, 3D Echo and Transesophageal Echo Indications:     Mitral Regurgitation  History:         Patient has no prior history of Echocardiogram examinations.  Sonographer:     Roosvelt Maser RDCS Referring Phys:  [id number] [facility] B ROBERTS Diagnosing Phys: Dietrich Pates MD PROCEDURE: After discussion of the risks and benefits of a TEE, an informed consent was obtained from the patient. The transesophogeal probe was passed without difficulty through the esophogus of the patient. Sedation performed by different physician. The patient's vital signs; including heart rate, blood pressure, and oxygen saturation; remained stable throughout the procedure. The patient developed no complications during the procedure. IMPRESSIONS  1. Hypokinesis with aneurysmal dilation of inferior base. . Left ventricular ejection fraction, by estimation, is 55%%. The left ventricle has normal function.  2. Right ventricular systolic function is normal. The right ventricular size is normal.  3. No left  atrial/left atrial appendage thrombus was detected.  4. The posterior mitral leaflet is restriced in motion. There is a gap between P2/P3 segment with severe mitral regurgitation. MR directed posterior and around LA into R and L upper pulmonary veins. Vena contracta is 1cm2 Regurgitant volume is 63 cc.. Severe mitral valve regurgitation, Carpentier Type IIIB  5. The aortic valve is tricuspid. Aortic valve regurgitation is trivial.  6. There is mild (Grade II) plaque of thoracic aorta. FINDINGS  Left Ventricle: Hypokinesis with aneurysmal dilation of inferior base. Left ventricular ejection fraction, by estimation, is 55%%. The left ventricle has normal function. The left ventricular internal cavity size was normal in size. Right Ventricle: The right ventricular size is normal. Right vetricular wall thickness was not assessed. Right ventricular systolic function is normal. Left Atrium: Left atrial size was normal in size. No left atrial/left atrial appendage thrombus was detected. Right Atrium: Right atrial size was normal in size. Pericardium: There is no evidence of pericardial effusion. Mitral Valve: The posterior mitral leaflet is restriced im motion. There is a gap between P2/P3 segment with severe mitral regurgitation. MR directed posterior and around LA into R and L upper pulmonary veins. Vena contracta is 1cm2 Regurgitant volume is  63 cc. Severe mitral valve regurgitation. Tricuspid Valve: The tricuspid valve is normal in structure. Tricuspid valve regurgitation is trivial. Aortic Valve: The aortic valve is tricuspid. Aortic valve regurgitation is trivial. Pulmonic Valve: The pulmonic valve was normal in structure. Pulmonic valve regurgitation is trivial. Aorta: The aortic root is normal in size and structure. There is mild (Grade II) plaque. IAS/Shunts: The interatrial septum was not assessed.  MR Peak grad:    121.0 mmHg MR Mean grad:    77.0 mmHg MR Vmax:         550.00 cm/s MR Vmean:        415.0 cm/s MR  PISA:         6.28 cm MR PISA Eff ROA: 35 mm MR PISA Radius:  1.00 cm Dietrich Pates MD Electronically signed by Dietrich Pates MD Signature Date/Time: 04/28/2021/9:30:59 PM    Final       Assessment & Plan  70yo man who presented after syncopal episode and found to have severe 2 vessel CAD and severe MR. CTS has been consulted for CABG and MV r/R.  #CAD CABG consult ongoing.  #Syncope with scalp laceration Sutures ? Remove on 05/02/2021  #DM    For questions or updates, please contact [facility] Please consult [email or web address] for contact info under        Signed, Lanier Prude, MD  04/29/2021, 12:14 PM

## 2021-04-29 NOTE — Progress Notes (Signed)
I contacted Dr. Annett Gula with urology concerning suprapubic cath and he will see tomorrow.  I ordered u/a with C&S.

## 2021-04-30 ENCOUNTER — Inpatient Hospital Stay (HOSPITAL_COMMUNITY): Payer: Medicare Other

## 2021-04-30 DIAGNOSIS — Z01810 Encounter for preprocedural cardiovascular examination: Secondary | ICD-10-CM

## 2021-04-30 DIAGNOSIS — I2510 Atherosclerotic heart disease of native coronary artery without angina pectoris: Secondary | ICD-10-CM | POA: Diagnosis not present

## 2021-04-30 DIAGNOSIS — I340 Nonrheumatic mitral (valve) insufficiency: Secondary | ICD-10-CM | POA: Diagnosis not present

## 2021-04-30 LAB — BASIC METABOLIC PANEL
Anion gap: 8 (ref 5–15)
BUN: 18 mg/dL (ref 8–23)
CO2: 27 mmol/L (ref 22–32)
Calcium: 9.2 mg/dL (ref 8.9–10.3)
Chloride: 98 mmol/L (ref 98–111)
Creatinine, Ser: 1.02 mg/dL (ref 0.61–1.24)
GFR, Estimated: >60 mL/min (ref >60)
Glucose, Bld: 148 mg/dL — ABNORMAL HIGH (ref 70–99)
Potassium: 4.3 mmol/L (ref 3.5–5.1)
Sodium: 133 mmol/L — ABNORMAL LOW (ref 135–145)

## 2021-04-30 LAB — CBC
HCT: 38.2 % — ABNORMAL LOW (ref 39.0–52.0)
Hemoglobin: 13.1 g/dL (ref 13.0–17.0)
MCH: 30 pg (ref 26.0–34.0)
MCHC: 34.3 g/dL (ref 30.0–36.0)
MCV: 87.4 fL (ref 80.0–100.0)
Platelets: 230 10*3/uL (ref 150–400)
RBC: 4.37 MIL/uL (ref 4.22–5.81)
RDW: 13.9 % (ref 11.5–15.5)
WBC: 7.1 10*3/uL (ref 4.0–10.5)
nRBC: 0 % (ref 0.0–0.2)

## 2021-04-30 LAB — GLUCOSE, CAPILLARY
Glucose-Capillary: 155 mg/dL — ABNORMAL HIGH (ref 70–99)
Glucose-Capillary: 344 mg/dL — ABNORMAL HIGH (ref 70–99)
Glucose-Capillary: 321 mg/dL — ABNORMAL HIGH (ref 70–99)
Glucose-Capillary: 158 mg/dL — ABNORMAL HIGH (ref 70–99)
Glucose-Capillary: 150 mg/dL — ABNORMAL HIGH (ref 70–99)

## 2021-04-30 MED ORDER — ADULT MULTIVITAMIN W/MINERALS CH
1.0000 | ORAL_TABLET | Freq: Every day | ORAL | Status: DC
  Administered 2021-04-30 – 2021-05-09 (×9): 1 via ORAL
  Filled 2021-04-30 (×9): qty 1

## 2021-04-30 MED ORDER — ENSURE ENLIVE PO LIQD
237.0000 mL | Freq: Three times a day (TID) | ORAL | Status: DC
  Administered 2021-04-30 – 2021-05-01 (×3): 237 mL via ORAL

## 2021-04-30 NOTE — Progress Notes (Signed)
Pre-CABG study completed.  ° °Please see CV Proc for preliminary results.  ° °Jaton Eilers, RDMS, RVT ° °

## 2021-04-30 NOTE — Progress Notes (Signed)
Progress Note  Patient Name: Joseph Robles Date of Encounter: 04/30/2021  [facility] Cardiologist: None   Subjective   NAEO.  Seen by Dr Cliffton Asters yesterday--targeting mid-week for OR.  Urinary catheter exchange planned for today.   Inpatient Medications    Scheduled Meds:  aspirin EC  81 mg Oral Daily   atorvastatin  80 mg Oral QHS   insulin aspart  0-5 Units Subcutaneous QHS   insulin aspart  0-9 Units Subcutaneous TID WC   sodium chloride flush  3 mL Intravenous Q12H   Continuous Infusions:  sodium chloride     PRN Meds: sodium chloride, acetaminophen, ondansetron (ZOFRAN) IV, oxybutynin, sodium chloride flush   Vital Signs    Vitals:   04/29/21 0845 04/29/21 1706 04/29/21 1952 04/30/21 0539  BP: 117/78 122/77 116/76 106/67  Pulse: 62 65 66 (!) 53  Resp: 18 16 18 18   Temp:  97.9 F (36.6 C) 98.1 F (36.7 C) 98 F (36.7 C)  TempSrc:  Oral Oral Oral  SpO2: 98% 99% 97%   Weight:      Height:        Intake/Output Summary (Last 24 hours) at 04/30/2021 0951 Last data filed at 04/30/2021 0800 Gross per 24 hour  Intake 360 ml  Output 1400 ml  Net -1040 ml    Last 3 Weights 04/28/2021  Weight (lbs) 150 lb  Weight (kg) 68.04 kg      Telemetry    No significant arrhythmias. Personally Reviewed  ECG    Personally Reviewed  Physical Exam   GEN: No acute distress.   Neck: No JVD Cardiac: RRR, no murmurs, rubs, or gallops.  Respiratory: Clear to auscultation bilaterally. GI: Soft, nontender, non-distended  MS: No edema; No deformity. Neuro:  Nonfocal  Psych: Normal affect   Labs    High Sensitivity Troponin:  No results for input(s): TROPONINIHS in the last 720 hours.   Chemistry Recent Labs  Lab 04/27/21 0224 04/29/21 0250 04/30/21 0204  NA 130* 134* 133*  K 3.8 4.1 4.3  CL 98 99 98  CO2 24 29 27   GLUCOSE 135* 101* 148*  BUN 12 15 18   CREATININE 0.78 0.89 1.02  CALCIUM 8.8* 9.2 9.2  GFRNONAA >60 >60 >60  ANIONGAP 8 6 8       Lipids  Recent Labs  Lab 04/27/21 0224  CHOL 143  TRIG 57  HDL 47  LDLCALC 85  CHOLHDL 3.0     Hematology Recent Labs  Lab 04/27/21 0224 04/29/21 0250 04/30/21 0204  WBC 8.3 6.5 7.1  RBC 4.21* 4.42 4.37  HGB 12.5* 12.9* 13.1  HCT 36.7* 38.9* 38.2*  MCV 87.2 88.0 87.4  MCH 29.7 29.2 30.0  MCHC 34.1 33.2 34.3  RDW 14.2 13.9 13.9  PLT 229 230 230    Thyroid No results for input(s): TSH, FREET4 in the last 168 hours.  BNPNo results for input(s): BNP, PROBNP in the last 168 hours.  DDimer No results for input(s): DDIMER in the last 168 hours.   Radiology    ECHO TEE  Result Date: 04/28/2021    TRANSESOPHOGEAL ECHO REPORT   Patient Name:   Joseph Robles Date of Exam: 04/28/2021 Medical Rec #:  05/02/21   Height:       72.0 in Accession #:    [id number]  Weight:       150.0 lb Date of Birth:  [birth year]   BSA:          1.885 m Patient Age:  70 years    BP:           134/75 mmHg Patient Gender: M           HR:           77 bpm. Exam Location:  Inpatient Procedure: Cardiac Doppler, Color Doppler, 3D Echo and Transesophageal Echo Indications:     Mitral Regurgitation  History:         Patient has no prior history of Echocardiogram examinations.  Sonographer:     Roosvelt Maser RDCS Referring Phys:  [id number] [facility] B ROBERTS Diagnosing Phys: Dietrich Pates MD PROCEDURE: After discussion of the risks and benefits of a TEE, an informed consent was obtained from the patient. The transesophogeal probe was passed without difficulty through the esophogus of the patient. Sedation performed by different physician. The patient's vital signs; including heart rate, blood pressure, and oxygen saturation; remained stable throughout the procedure. The patient developed no complications during the procedure. IMPRESSIONS  1. Hypokinesis with aneurysmal dilation of inferior base. . Left ventricular ejection fraction, by estimation, is 55%%. The left ventricle has normal function.  2. Right ventricular systolic  function is normal. The right ventricular size is normal.  3. No left atrial/left atrial appendage thrombus was detected.  4. The posterior mitral leaflet is restriced in motion. There is a gap between P2/P3 segment with severe mitral regurgitation. MR directed posterior and around LA into R and L upper pulmonary veins. Vena contracta is 1cm2 Regurgitant volume is 63 cc.. Severe mitral valve regurgitation, Carpentier Type IIIB  5. The aortic valve is tricuspid. Aortic valve regurgitation is trivial.  6. There is mild (Grade II) plaque of thoracic aorta. FINDINGS  Left Ventricle: Hypokinesis with aneurysmal dilation of inferior base. Left ventricular ejection fraction, by estimation, is 55%%. The left ventricle has normal function. The left ventricular internal cavity size was normal in size. Right Ventricle: The right ventricular size is normal. Right vetricular wall thickness was not assessed. Right ventricular systolic function is normal. Left Atrium: Left atrial size was normal in size. No left atrial/left atrial appendage thrombus was detected. Right Atrium: Right atrial size was normal in size. Pericardium: There is no evidence of pericardial effusion. Mitral Valve: The posterior mitral leaflet is restriced im motion. There is a gap between P2/P3 segment with severe mitral regurgitation. MR directed posterior and around LA into R and L upper pulmonary veins. Vena contracta is 1cm2 Regurgitant volume is  63 cc. Severe mitral valve regurgitation. Tricuspid Valve: The tricuspid valve is normal in structure. Tricuspid valve regurgitation is trivial. Aortic Valve: The aortic valve is tricuspid. Aortic valve regurgitation is trivial. Pulmonic Valve: The pulmonic valve was normal in structure. Pulmonic valve regurgitation is trivial. Aorta: The aortic root is normal in size and structure. There is mild (Grade II) plaque. IAS/Shunts: The interatrial septum was not assessed.  MR Peak grad:    121.0 mmHg MR Mean grad:     77.0 mmHg MR Vmax:         550.00 cm/s MR Vmean:        415.0 cm/s MR PISA:         6.28 cm MR PISA Eff ROA: 35 mm MR PISA Radius:  1.00 cm Dietrich Pates MD Electronically signed by Dietrich Pates MD Signature Date/Time: 04/28/2021/9:30:59 PM    Final       Assessment & Plan    70yo man who presented after syncopal episode and found to have severe 2 vessel CAD and  severe MR. CTS has been consulted for CABG and MV r/R.  #CAD CABG/MV consult ongoing--planning for surgery mid week  #Syncope with scalp laceration Sutures ? Remove on 05/02/2021  #DM    For questions or updates, please contact [facility] Please consult [email or web address] for contact info under        Signed, Lanier Prude, MD  04/30/2021, 9:51 AM

## 2021-04-30 NOTE — Progress Notes (Addendum)
Initial Nutrition Assessment  DOCUMENTATION CODES:   Not applicable, suspect some degree of malnutrition is present but unable to confirm at this time  INTERVENTION:   - Liberalize diet to Carb Modified to promote PO intake, verbal with readback order placed per Dr. Lalla Brothers  - Ensure Enlive po TID, each supplement provides 350 kcal and 20 grams of protein  - Please obtain measured admission weight  - MVI with minerals daily  - Encourage PO intake  NUTRITION DIAGNOSIS:   Increased nutrient needs related to acute illness as evidenced by estimated needs.  GOAL:   Patient will meet greater than or equal to 90% of their needs  MONITOR:   PO intake, Supplement acceptance, Labs, Weight trends, Skin  REASON FOR ASSESSMENT:   Malnutrition Screening Tool    ASSESSMENT:   70 year old male who presented from [facility] on 9/21 with severe MR. PMH of tobacco abuse, HLD, T2DM, urethral stricture managed with a suprapubic catheter.  9/21 - s/p R/L heart cath showing severe two-vessel CAD 9/23 - s/p TEE  Tentative plan is for pt to go to the OR mid-week for CABG and MV repair.  Pt currently on a Heart Healthy/Carb Modified diet. No meal completions charted.  Per review of TCTS note, pt reported that his health has been declining over the last 1.5 years. Pt also reported that this started with a febrile illness with associated N/V that lasted 3-4 days. Since that time, pt with decreased appetite and steady weight loss. Pt reported a total weight loss of 30 lbs.  Spoke with pt via phone call to room. Pt reports that he has a good appetite and has been eating well. Pt reports that he eats 3 meals daily with snacks at home. Pt reports consuming foods like chicken, potatoes, bread, steak. He states that he does watch his blood sugar at home but only checks it every few days.  Pt states that he had an illness around Christmas about 2 years ago (December 2020). This illness caused  him to have N/V and be very sick in bed for 3-4 days. Pt reports that before this, he weighed 180 lbs which is his UBW. He reports losing weight down to 150 lbs and never being able to gain weight back. No weight history available in chart. Admit weight of 150 lbs (68 kg) appears to be stated rather than measured. Recommend obtaining updated measured weight. Suspect pt with some degree of malnutrition but unable to confirm without NFPE. Will obtain NFPE at follow-up.  Pt amenable to receiving oral nutrition supplements during admission. He reports that his DM doctor recommended he consume Ensure or Glucerna at home. RD to order Ensure Enlive as it is highest in kcal and protein. Will also order daily MVI with minerals. Recommend liberalizing diet to Regular to promote PO intake. Reached out to MD via secure chat who agreed with liberalizing diet to Regular. RN reached out about high CBGs today - will liberalize diet to Carb Modified. Discussed with pt the importance of adequate kcal and protein intake leading up to surgery. Pt expresses understanding.  Medications reviewed and include: SSI  Labs reviewed: sodium 133 CBG's: 155-221 x 24 hours  UOP: 750 ml x 24 hours I/O's: -5.6 L since admit  NUTRITION - FOCUSED PHYSICAL EXAM:  Unable to complete at this time. RD working remotely.  Diet Order:   Diet Order             Diet heart healthy/carb modified Room service  appropriate? Yes; Fluid consistency: Thin  Diet effective now                   EDUCATION NEEDS:   Education needs have been addressed  Skin:  Skin Assessment: Skin Integrity Issues: Other: laceration posterior head  Last BM:  04/28/21  Height:   Ht Readings from Last 1 Encounters:  04/28/21 6' (1.829 m)    Weight:   Wt Readings from Last 1 Encounters:  04/28/21 68 kg    BMI:  Body mass index is 20.34 kg/m.  Estimated Nutritional Needs:   Kcal:  2050-2250  Protein:  95-115 grams  Fluid:  >/= 2.0  L    Mertie Clause, MS, RD, LDN Inpatient Clinical Dietitian Please see AMiON for contact information.

## 2021-04-30 NOTE — Consult Note (Signed)
I have been asked to see the patient by Dr. Everette Rank, for evaluation and management of Suprapubic tube management.  History of present illness:     [name] is a 70 year-old male with a complex urologic history. In brief summary, he has an almost completely impassable urethra where he had overlapping UroLume stents that have failed. He had calcifications that required surgical debridement, and which time suprapubic tube was placed back in 2019. He has had this changed on a serial basis.  He is being managed by [facility] urology.  Currently he has a 20 French suprapubic catheter that is being changed every 2 weeks.  He is irrigating and troubleshooting the catheter on his own.  In my extensive chart review on care everywhere I do not see that he has had any infections over the past year.  His last suprapubic tube change was on 04/19/2021.  The patient is currently admitted and scheduled to undergo extensive cardiac surgery including an artificial valve.  There is concern for his risk of seeding the valve from from the urinary tract resulting from his chronic indwelling catheter.  Review of systems: A 12 point comprehensive review of systems was obtained and is negative unless otherwise stated in the history of present illness.  Patient Active Problem List   Diagnosis Date Noted   Severe mitral regurgitation 04/26/2021   Nonrheumatic mitral valve regurgitation    Syncope and collapse     No current facility-administered medications on file prior to encounter.   Current Outpatient Medications on File Prior to Encounter  Medication Sig Dispense Refill   ascorbic acid (VITAMIN C) 100 MG tablet Take 100 mg by mouth daily.     atorvastatin (LIPITOR) 10 MG tablet Take 10 mg by mouth daily.     Cholecalciferol 25 MCG (1000 UT) tablet Take 1,000 Units by mouth daily.     cyanocobalamin 100 MCG tablet Take 100 mcg by mouth daily.     metFORMIN (GLUCOPHAGE) 1000 MG tablet Take 1,000 mg by mouth 2  (two) times daily.     nystatin cream (MYCOSTATIN) Apply 1 application topically 2 (two) times daily.     oxybutynin (DITROPAN) 5 MG tablet Take 5 mg by mouth daily as needed for bladder spasms.     sitaGLIPtin (JANUVIA) 100 MG tablet Take 100 mg by mouth daily.      Past Medical History:  Diagnosis Date   DM type 2 (diabetes mellitus, type 2) (HCC)    Hyperlipidemia    Tobacco abuse     Past Surgical History:  Procedure Laterality Date   RIGHT/LEFT HEART CATH AND CORONARY ANGIOGRAPHY N/A 04/26/2021   Procedure: RIGHT/LEFT HEART CATH AND CORONARY ANGIOGRAPHY;  Surgeon: Kathleene Hazel, MD;  Location: [facility] INVASIVE CV [facility];  Service: Cardiovascular;  Laterality: N/A;   URETERAL STENT PLACEMENT      Social History   Tobacco Use   Smoking status: Every Day    Packs/day: 0.50    Types: Cigarettes   Smokeless tobacco: Never  Substance Use Topics   Alcohol use: Yes    Comment: social drinker   Drug use: No    Family History  Problem Relation Age of Onset   Hyperlipidemia Father     PE: Vitals:   04/29/21 0845 04/29/21 1706 04/29/21 1952 04/30/21 0539  BP: 117/78 122/77 116/76 106/67  Pulse: 62 65 66 (!) 53  Resp: 18 16 18 18   Temp:  97.9 F (36.6 C) 98.1 F (36.7 C) 98  F (36.7 C)  TempSrc:  Oral Oral Oral  SpO2: 98% 99% 97%   Weight:      Height:       Patient appears to be in no acute distress  patient is alert and oriented x3 Atraumatic normocephalic head No cervical or supraclavicular lymphadenopathy appreciated No increased work of breathing, no audible wheezes/rhonchi Regular sinus rhythm/rate Abdomen is soft, nontender, nondistended, no CVA or suprapubic tenderness Suprapubic tube site is clean without evidence of erythema or cellulitis. Lower extremities are symmetric without appreciable edema Grossly neurologically intact No identifiable skin lesions  Recent Labs    04/29/21 0250 04/30/21 0204  WBC 6.5 7.1  HGB 12.9* 13.1  HCT 38.9* 38.2*    Recent Labs    04/29/21 0250 04/30/21 0204  NA 134* 133*  K 4.1 4.3  CL 99 98  CO2 29 27  GLUCOSE 101* 148*  BUN 15 18  CREATININE 0.89 1.02  CALCIUM 9.2 9.2   No results for input(s): LABPT, INR in the last 72 hours. No results for input(s): LABURIN in the last 72 hours. No results found for this or any previous visit.  Procedure: Informed consent was obtained.  I subsequently remove the patient suprapubic tube by deflating the balloon and pulling it out.  I then reviewed prepped the area and with some lubrication gently inserted a new 20 French latex Foley catheter into his bladder through the SP site.  I irrigated the Foley with 60 cc of normal saline which irrigated quite easily.  I instilled approximately 13 cc of sterile water into the balloon.  He tolerated the procedure well.  Imaging: none  Imp: The patient has a impassable urethra secondary to occluded UroLume stents that were placed approximately 20 years ago.  Attempts at urethroplasty were aborted at [facility] with advice to perform a urinary diversion.  The patient at this point has been managing it with suprapubic tubes.  It has not been without problems, as he has had to go to the ER several times.  However, he is now more adept at managing it, and is able to irrigate on its own.  Because of the difficulties he has had with occlusion he has it changed every 2 weeks.  Fortunately he has not had any infections or been treated for any infections since the tube was placed in 2019.  Recommendations: The SP tube was changed today.  It was easily changed.  He is done very well with the suprapubic tube as a relates to recurrent infections with no real history of that.  From a recurrent urinary tract infection I think his risk is low, and I recommended that we at least in the short-term continue with changing his catheter every 2 weeks.  The patient and I discussed long-term options, and he would like to switch to our practice  for further follow-up moving forward.  The patient is still in the hospital in 2 weeks, we can, change his suprapubic catheter if that house staff is not comfortable with doing it.  If he is discharged, we will follow-up in our clinic.   Thank you for involving me in this patient's care, Please page with any further questions or concerns. Crist Fat

## 2021-05-01 ENCOUNTER — Inpatient Hospital Stay (HOSPITAL_COMMUNITY): Payer: Medicare Other

## 2021-05-01 ENCOUNTER — Encounter (HOSPITAL_COMMUNITY): Payer: Self-pay | Admitting: Internal Medicine

## 2021-05-01 DIAGNOSIS — I25118 Atherosclerotic heart disease of native coronary artery with other forms of angina pectoris: Secondary | ICD-10-CM | POA: Diagnosis not present

## 2021-05-01 DIAGNOSIS — I2510 Atherosclerotic heart disease of native coronary artery without angina pectoris: Secondary | ICD-10-CM | POA: Diagnosis not present

## 2021-05-01 DIAGNOSIS — R55 Syncope and collapse: Secondary | ICD-10-CM | POA: Diagnosis not present

## 2021-05-01 DIAGNOSIS — I340 Nonrheumatic mitral (valve) insufficiency: Secondary | ICD-10-CM | POA: Diagnosis not present

## 2021-05-01 LAB — GLUCOSE, CAPILLARY
Glucose-Capillary: 162 mg/dL — ABNORMAL HIGH (ref 70–99)
Glucose-Capillary: 346 mg/dL — ABNORMAL HIGH (ref 70–99)

## 2021-05-01 LAB — PULMONARY FUNCTION TEST
FEF 25-75 Pre: 1.8 L/sec
FEF2575-%Pred-Pre: 65 %
FEV1-%Pred-Pre: 88 %
FEV1-Pre: 3.17 L
FEV1FVC-%Pred-Pre: 88 %
FEV6-%Pred-Pre: 104 %
FEV6-Pre: 4.81 L
FEV6FVC-%Pred-Pre: 104 %
FVC-%Pred-Pre: 100 %
FVC-Pre: 4.87 L
Pre FEV1/FVC ratio: 65 %
Pre FEV6/FVC Ratio: 99 %

## 2021-05-01 MED ORDER — ENOXAPARIN SODIUM 40 MG/0.4ML IJ SOSY
40.0000 mg | PREFILLED_SYRINGE | INTRAMUSCULAR | Status: DC
  Administered 2021-05-01 – 2021-05-02 (×2): 40 mg via SUBCUTANEOUS
  Filled 2021-05-01 (×2): qty 0.4

## 2021-05-01 MED ORDER — GERHARDT'S BUTT CREAM
TOPICAL_CREAM | CUTANEOUS | Status: DC | PRN
  Filled 2021-05-01: qty 1

## 2021-05-01 NOTE — Progress Notes (Addendum)
Progress Note  Patient Name: Joseph Robles Date of Encounter: 05/01/2021  [facility] Cardiologist: Verne Carrow, MD   Subjective   Feeling well. No chest pain, sob or palpitations.    Inpatient Medications    Scheduled Meds:  aspirin EC  81 mg Oral Daily   atorvastatin  80 mg Oral QHS   feeding supplement  237 mL Oral TID BM   insulin aspart  0-5 Units Subcutaneous QHS   insulin aspart  0-9 Units Subcutaneous TID WC   multivitamin with minerals  1 tablet Oral Daily   sodium chloride flush  3 mL Intravenous Q12H   Continuous Infusions:  sodium chloride     PRN Meds: sodium chloride, acetaminophen, Gerhardt's butt cream, ondansetron (ZOFRAN) IV, oxybutynin, sodium chloride flush   Vital Signs    Vitals:   04/30/21 0539 04/30/21 1400 04/30/21 2018 05/01/21 0427  BP: 106/67 (!) 107/59 107/74 (!) 106/58  Pulse: (!) 53 70 (!) 47 (!) 52  Resp: 18 16 18 17   Temp: 98 F (36.7 C) 98.1 F (36.7 C) 98.2 F (36.8 C) 97.9 F (36.6 C)  TempSrc: Oral Oral Oral Oral  SpO2:  100% 95% 97%  Weight:      Height:        Intake/Output Summary (Last 24 hours) at 05/01/2021 0818 Last data filed at 05/01/2021 0424 Gross per 24 hour  Intake --  Output 1075 ml  Net -1075 ml   Last 3 Weights 04/28/2021  Weight (lbs) 150 lb  Weight (kg) 68.04 kg      Telemetry    Sinus rhythm - Personally Reviewed  ECG    N/A - Personally Reviewed  Physical Exam   GEN: No acute distress.   Neck: No JVD Cardiac: RRR, no murmurs, rubs, or gallops.  Respiratory: Clear to auscultation bilaterally. GI: Soft, nontender, non-distended  MS: No edema; No deformity. Neuro:  Nonfocal  Psych: Normal affect   Labs    High Sensitivity Troponin:  No results for input(s): TROPONINIHS in the last 720 hours.   Chemistry Recent Labs  Lab 04/27/21 0224 04/29/21 0250 04/30/21 0204  NA 130* 134* 133*  K 3.8 4.1 4.3  CL 98 99 98  CO2 24 29 27   GLUCOSE 135* 101* 148*  BUN 12 15 18    CREATININE 0.78 0.89 1.02  CALCIUM 8.8* 9.2 9.2  GFRNONAA >60 >60 >60  ANIONGAP 8 6 8     Lipids  Recent Labs  Lab 04/27/21 0224  CHOL 143  TRIG 57  HDL 47  LDLCALC 85  CHOLHDL 3.0    Hematology Recent Labs  Lab 04/27/21 0224 04/29/21 0250 04/30/21 0204  WBC 8.3 6.5 7.1  RBC 4.21* 4.42 4.37  HGB 12.5* 12.9* 13.1  HCT 36.7* 38.9* 38.2*  MCV 87.2 88.0 87.4  MCH 29.7 29.2 30.0  MCHC 34.1 33.2 34.3  RDW 14.2 13.9 13.9  PLT 229 230 230   Thyroid No results for input(s): TSH, FREET4 in the last 168 hours.  BNPNo results for input(s): BNP, PROBNP in the last 168 hours.  DDimer No results for input(s): DDIMER in the last 168 hours.   Radiology    VAS 04/29/21 DOPPLER PRE CABG  Result Date: 04/30/2021 PREOPERATIVE VASCULAR EVALUATION Patient Name:  Joseph Robles  Date of Exam:   04/30/2021 Medical Rec #: [place]    Accession #:    05/02/2021 Date of Birth: 09-11-1950    Patient Gender: O Patient Age:   70 years Exam Location:  [facility] Procedure:      VAS US DOPPLER PRE CABG Referring Phys: --------------------------------------------------------------------------------  Indications:      Pre-CABG. Risk Factors:     Diabetes, coronary artery disease. Comparison Study: No prior study Performing Technologist: Sherren Kerns RVS  Examination Guidelines: A complete evaluation includes B-mode imaging, spectral Doppler, color Doppler, and power Doppler as needed of all accessible portions of each vessel. Bilateral testing is considered an integral part of a complete examination. Limited examinations for reoccurring indications may be performed as noted.  ABI Findings: +---------+------------------+-----+-----------+--------+ Right    Rt Pressure (mmHg)IndexWaveform   Comment  +---------+------------------+-----+-----------+--------+ Brachial 128                    multiphasic         +---------+------------------+-----+-----------+--------+ PTA      169               1.32  multiphasic         +---------+------------------+-----+-----------+--------+ DP       158               1.23 multiphasic         +---------+------------------+-----+-----------+--------+ Great Toe111               0.87 multiphasic         +---------+------------------+-----+-----------+--------+ +---------+------------------+-----+-----------+-------+ Left     Lt Pressure (mmHg)IndexWaveform   Comment +---------+------------------+-----+-----------+-------+ Brachial 124                    multiphasic        +---------+------------------+-----+-----------+-------+ PTA      169               1.32 multiphasic        +---------+------------------+-----+-----------+-------+ DP       154               1.20 multiphasic        +---------+------------------+-----+-----------+-------+ Great Toe108               0.84 multiphasic        +---------+------------------+-----+-----------+-------+ +-------+---------------+----------------+ ABI/TBIToday's ABI/TBIPrevious ABI/TBI +-------+---------------+----------------+ Right  1.3/0.87                        +-------+---------------+----------------+ Left   1.3/0.84                        +-------+---------------+----------------+  Right Doppler Findings: +--------+--------+-----+-----------+--------+ Site    PressureIndexDoppler    Comments +--------+--------+-----+-----------+--------+ [id number]          multiphasic         +--------+--------+-----+-----------+--------+ Radial               multiphasic         +--------+--------+-----+-----------+--------+ Ulnar                multiphasic         +--------+--------+-----+-----------+--------+  Left Doppler Findings: +--------+--------+-----+-----------+--------+ Site    PressureIndexDoppler    Comments +--------+--------+-----+-----------+--------+ [id number]          multiphasic         +--------+--------+-----+-----------+--------+  Radial               multiphasic         +--------+--------+-----+-----------+--------+ Ulnar                multiphasic         +--------+--------+-----+-----------+--------+  Right ABI: Resting  right ankle-brachial index is within normal range. No evidence of significant right lower extremity arterial disease. The right toe-brachial index is normal. Left ABI: Resting left ankle-brachial index is within normal range. No evidence of significant left lower extremity arterial disease. The left toe-brachial index is normal. Right Upper Extremity: Doppler waveforms remain within normal limits with right radial compression. Doppler waveforms decrease >50% with right ulnar compression. Left Upper Extremity: Doppler waveforms remain within normal limits with left radial compression. Doppler waveform obliterate with left ulnar compression.    Preliminary     Cardiac Studies    Echo 04/28/21 1. Hypokinesis with aneurysmal dilation of inferior base. . Left  ventricular ejection fraction, by estimation, is 55%%. The left ventricle  has normal function.   2. Right ventricular systolic function is normal. The right ventricular  size is normal.   3. No left atrial/left atrial appendage thrombus was detected.   4. The posterior mitral leaflet is restriced in motion. There is a gap  between P2/P3 segment with severe mitral regurgitation. MR directed  posterior and around LA into R and L upper pulmonary veins. Vena contracta  is 1cm2 Regurgitant volume is 63 cc..  Severe mitral valve regurgitation, Carpentier Type IIIB   5. The aortic valve is tricuspid. Aortic valve regurgitation is trivial.   6. There is mild (Grade II) plaque of thoracic aorta.   RIGHT/LEFT HEART CATH AND CORONARY ANGIOGRAPHY   Conclusion      Prox RCA to Mid RCA lesion is 100% stenosed.   Prox RCA lesion is 70% stenosed.   Prox Cx to Mid Cx lesion is 80% stenosed.   Mid LAD to Dist LAD lesion is 30% stenosed.   LV end  diastolic pressure is normal.   Chronic total occlusion of the mid RCA. The mid and distal vessel fills from left to right collaterals.  Severe proximal Circumflex stenosis Mild mid LAD stenosis Large V waves suggestive of severe mitral regurgitation. (Echo with severe MR)   Recommendations: He will be admitted to our cardiology service. Continue workup for potential mitral valve surgery and CABG. He will likely need a TEE. Will ask CT surgery to see him after the TEE.   Diagnostic Dominance: Right   Patient Profile     70 y.o. male with PMH HLD, DM type II, and tobacco abuse who is being seen for the evaluation of severe mitral regurgitation and CAD.   Patient presented after a syncopal episode resulting in laceration to posterior head.  He had no prodromal chest pain, palpitations, diaphoresis, nausea, vomiting, shortness of breath.  Echocardiogram at [facility] showed EF 55-60%, basilar inferior akinesis with aneurysmal wall, and severe MR.  He was transferred to [facility] for further work up.   Assessment & Plan    Syncope Severe MR CAD  Underwent cardiac cath noted above with pRCA 70% mRCA 100% with left to right collaterals, pLcx 80%. Large V waves suggestive of severe mitral regurgitation. (Echo with severe MR). Plan to take to OR this week.  - Continue ASA and statin   4. Scalp laceration: 6 sutures to posterior scalp placed  - Likely removal of suture tomorrow   5. DM type 2 - A1c 7.0 at [facility]. On metformin and januvia at home -- Continue SSI this admission   6. HLD - 04/27/2021: Cholesterol 143; HDL 47; LDL Cholesterol 85; Triglycerides 57; VLDL 11  - Continue high intensity statin   7. Urology - S/p suprapubic catheter change yesterday by urology.  Plan to change again in 2 weeks.   For questions or updates, please contact [facility] Please consult [email or web address] for contact info under      SignedManson Passey, PA  05/01/2021, 8:18 AM      Patient seen and examined. Agree with assessment and plan.  No chest pain.  Sinus rhythm in the 90s.  No JVD.  Lungs relatively clear.  Rhythm regular with 1/6 systolic murmur in the aortic region and 2-3/6 systolic murmur at the apex consistent with his MR.  No chest wall tenderness.  Abdomen soft nontender.  Patient states he has significant rash on his buttocks and was just started on "Gerhardt's Butt cream."  No edema.  TEE showed hypokinesis with aneurysmal dilatation of the inferior base with EF estimated 55%.  The posterior mitral valve leaflet was restricted in motion between P2/P3 segment resulting in severe mitral regurgitation, Carpentier type IIIb consistent with secondary MR. Awaiting surgical finalization of timing of mitral valve surgery and CABG revascularization.   Lennette Bihari, MD, [facility] 05/01/2021 12:20 PM

## 2021-05-01 NOTE — Progress Notes (Signed)
CARDIAC REHAB PHASE I   PRE:  Rate/Rhythm: 70 SR    BP: sitting 127/76    SaO2: 96 RA  MODE:  Ambulation: 640 ft   POST:  Rate/Rhythm: 101 ST    BP: sitting 129/85     SaO2: 97 RA  Tolerated well, enjoyed walking. No c/o. Discussed IS (ordered, hasn't practiced yet), sternal precautions, mobility post op, and d/c planning with pt and wife. Receptive.  Encouraged activity as tolerated. Wife will be with him at d/c. [phone]  Harriet Masson CES, ACSM 05/01/2021 3:32 PM

## 2021-05-01 NOTE — Progress Notes (Signed)
3 Days Post-Op Procedure(s) (LRB): TRANSESOPHAGEAL ECHOCARDIOGRAM (TEE) (N/A) Subjective: No complaints  Objective: Vital signs in last 24 hours: Temp:  [97.9 F (36.6 C)-98.2 F (36.8 C)] 98.2 F (36.8 C) (09/26 1528) Pulse Rate:  [47-80] 74 (09/26 1528) Cardiac Rhythm: Normal sinus rhythm (09/26 1100) Resp:  [17-18] 18 (09/26 1528) BP: (106-129)/(58-85) 129/85 (09/26 1528) SpO2:  [95 %-98 %] 97 % (09/26 1528)  Hemodynamic parameters for last 24 hours:    Intake/Output from previous day: 09/25 0701 - 09/26 0700 In: 360 [P.O.:360] Out: 1725 [Urine:1725] Intake/Output this shift: No intake/output data recorded.  General appearance: alert, cooperative, and no distress Neurologic: intact Heart: regular rate and rhythm and + holosystolic murmur Lungs: clear to auscultation bilaterally  Lab Results: Recent Labs    04/29/21 0250 04/30/21 0204  WBC 6.5 7.1  HGB 12.9* 13.1  HCT 38.9* 38.2*  PLT 230 230   BMET:  Recent Labs    04/29/21 0250 04/30/21 0204  NA 134* 133*  K 4.1 4.3  CL 99 98  CO2 29 27  GLUCOSE 101* 148*  BUN 15 18  CREATININE 0.89 1.02  CALCIUM 9.2 9.2    PT/INR: No results for input(s): LABPROT, INR in the last 72 hours. ABG    Component Value Date/Time   PHART 7.405 04/26/2021 1353   HCO3 23.3 04/26/2021 1353   TCO2 24 04/26/2021 1353   ACIDBASEDEF 1.0 04/26/2021 1353   O2SAT 99.0 04/26/2021 1353   CBG (last 3)  Recent Labs    04/30/21 2123 05/01/21 0750 05/01/21 1224  GLUCAP 150* 162* 346*    Assessment/Plan: S/P Procedure(s) (LRB): TRANSESOPHAGEAL ECHOCARDIOGRAM (TEE) (N/A) - Severe MR with 2 vessel CAD involving RCA and circumflex. CABG MV repair or replacement is indicated. I think this valve will be repairable, but did inform Mr. Crombie that replacement is always a consideration. Informed him of the advantages and disadvantages of mechanical v tissue valves should replacement be necessary.  I discussed the general nature of  the procedure, including the need for general anesthesia, the incisions to be used, the use of cardiopulmonary bypass, the use of drainage tubes and temporary pacemaker wires with Mr and Mrs Loveall.  We discussed the expected hospital stay, overall recovery and short and long term outcomes.  I informed them of the indications, risks, benefits and alternatives.  They understand the risks, include but are not limited to death, stroke, MI, DVT/PE, bleeding, possible need for transfusion, infections, cardiac arrhythmias, complete heart block requiring pacemaker placement, as well as other organ system dysfunction including respiratory, renal, or GI complications.   He accepts the risks and agrees to proceed.   Plan CABG mitral repair Wed 9/28   LOS: 5 days    Loreli Slot 05/01/2021

## 2021-05-01 NOTE — Progress Notes (Signed)
Inpatient Diabetes Program Recommendations  AACE/ADA: New Consensus Statement on Inpatient Glycemic Control (2015)  Target Ranges:  Prepandial:   less than 140 mg/dL      Peak postprandial:   less than 180 mg/dL (1-2 hours)      Critically ill patients:  140 - 180 mg/dL   Lab Results  Component Value Date   GLUCAP 162 (H) 05/01/2021    Review of Glycemic Control Results for Joseph Robles, Joseph Robles (MRN [id number]) as of 05/01/2021 10:13  Ref. Range 04/30/2021 08:02 04/30/2021 11:54 04/30/2021 12:56 04/30/2021 16:29 04/30/2021 21:23 05/01/2021 07:50  Glucose-Capillary Latest Ref Range: 70 - 99 mg/dL 678 (H) 938 (H) 101 (H) 158 (H) 150 (H) 162 (H)   Diabetes history: DM 2 Outpatient Diabetes medications: Metformin 1000 mg bid, Januvia 100 mg Daily Current orders for Inpatient glycemic control:  Novolog 0-9 units tid + hs  Ensure enlive tid between meals  Inpatient Diabetes Program Recommendations:    Note: Supplement pt is on tends to spike glucose trends may want to consult dietitian for another option.  - Increase Novolog correction to 0-15 units tid   Thanks  Christena Deem RN, MSN, BC-ADM Inpatient Diabetes Coordinator Team Pager [phone] (8a-5p)

## 2021-05-02 ENCOUNTER — Inpatient Hospital Stay (HOSPITAL_COMMUNITY): Payer: Medicare Other

## 2021-05-02 DIAGNOSIS — I25119 Atherosclerotic heart disease of native coronary artery with unspecified angina pectoris: Secondary | ICD-10-CM | POA: Diagnosis not present

## 2021-05-02 DIAGNOSIS — E43 Unspecified severe protein-calorie malnutrition: Secondary | ICD-10-CM | POA: Insufficient documentation

## 2021-05-02 DIAGNOSIS — R55 Syncope and collapse: Secondary | ICD-10-CM | POA: Diagnosis not present

## 2021-05-02 DIAGNOSIS — I340 Nonrheumatic mitral (valve) insufficiency: Secondary | ICD-10-CM | POA: Diagnosis not present

## 2021-05-02 HISTORY — DX: Unspecified severe protein-calorie malnutrition: E43

## 2021-05-02 LAB — GLUCOSE, CAPILLARY
Glucose-Capillary: 104 mg/dL — ABNORMAL HIGH (ref 70–99)
Glucose-Capillary: 136 mg/dL — ABNORMAL HIGH (ref 70–99)
Glucose-Capillary: 165 mg/dL — ABNORMAL HIGH (ref 70–99)
Glucose-Capillary: 195 mg/dL — ABNORMAL HIGH (ref 70–99)
Glucose-Capillary: 172 mg/dL — ABNORMAL HIGH (ref 70–99)
Glucose-Capillary: 175 mg/dL — ABNORMAL HIGH (ref 70–99)

## 2021-05-02 LAB — ABO/RH: ABO/RH(D): O NEG

## 2021-05-02 MED ORDER — CEFAZOLIN SODIUM-DEXTROSE 2-4 GM/100ML-% IV SOLN
2.0000 g | INTRAVENOUS | Status: AC
  Administered 2021-05-03 (×2): 2 g via INTRAVENOUS
  Filled 2021-05-02: qty 100

## 2021-05-02 MED ORDER — VANCOMYCIN HCL 1250 MG/250ML IV SOLN
1250.0000 mg | INTRAVENOUS | Status: AC
  Administered 2021-05-03: 1250 mg via INTRAVENOUS
  Filled 2021-05-02: qty 250

## 2021-05-02 MED ORDER — TRANEXAMIC ACID (OHS) BOLUS VIA INFUSION
15.0000 mg/kg | INTRAVENOUS | Status: AC
  Administered 2021-05-03: 1020 mg via INTRAVENOUS
  Filled 2021-05-02: qty 1020

## 2021-05-02 MED ORDER — GLUCERNA SHAKE PO LIQD
237.0000 mL | Freq: Three times a day (TID) | ORAL | Status: DC
  Administered 2021-05-04 – 2021-05-08 (×12): 237 mL via ORAL

## 2021-05-02 MED ORDER — TRANEXAMIC ACID (OHS) PUMP PRIME SOLUTION
2.0000 mg/kg | INTRAVENOUS | Status: DC
  Filled 2021-05-02: qty 1.36

## 2021-05-02 MED ORDER — BISACODYL 5 MG PO TBEC
5.0000 mg | DELAYED_RELEASE_TABLET | Freq: Once | ORAL | Status: AC
  Administered 2021-05-02: 5 mg via ORAL
  Filled 2021-05-02: qty 1

## 2021-05-02 MED ORDER — POTASSIUM CHLORIDE 2 MEQ/ML IV SOLN
80.0000 meq | INTRAVENOUS | Status: DC
  Filled 2021-05-02: qty 40

## 2021-05-02 MED ORDER — PHENYLEPHRINE HCL-NACL 20-0.9 MG/250ML-% IV SOLN
30.0000 ug/min | INTRAVENOUS | Status: AC
  Administered 2021-05-03: 20 ug/min via INTRAVENOUS
  Filled 2021-05-02: qty 250

## 2021-05-02 MED ORDER — MILRINONE LACTATE IN DEXTROSE 20-5 MG/100ML-% IV SOLN
0.3000 ug/kg/min | INTRAVENOUS | Status: DC
  Filled 2021-05-02: qty 100

## 2021-05-02 MED ORDER — DEXMEDETOMIDINE HCL IN NACL 400 MCG/100ML IV SOLN
0.1000 ug/kg/h | INTRAVENOUS | Status: AC
Start: 2021-05-03 — End: 2021-05-03
  Administered 2021-05-03: .4 ug/kg/h via INTRAVENOUS
  Filled 2021-05-02: qty 100

## 2021-05-02 MED ORDER — CHLORHEXIDINE GLUCONATE 4 % EX LIQD
Freq: Once | CUTANEOUS | Status: DC
Start: 2021-05-02 — End: 2021-05-03
  Filled 2021-05-02: qty 60

## 2021-05-02 MED ORDER — PLASMA-LYTE A IV SOLN
INTRAVENOUS | Status: DC
  Filled 2021-05-02: qty 5

## 2021-05-02 MED ORDER — METOPROLOL TARTRATE 12.5 MG HALF TABLET
12.5000 mg | ORAL_TABLET | Freq: Once | ORAL | Status: AC
  Administered 2021-05-03: 12.5 mg via ORAL
  Filled 2021-05-02: qty 1

## 2021-05-02 MED ORDER — INSULIN REGULAR(HUMAN) IN NACL 100-0.9 UT/100ML-% IV SOLN
INTRAVENOUS | Status: AC
  Administered 2021-05-03: 4.2 [IU]/h via INTRAVENOUS
  Filled 2021-05-02: qty 100

## 2021-05-02 MED ORDER — TRANEXAMIC ACID 1000 MG/10ML IV SOLN
1.5000 mg/kg/h | INTRAVENOUS | Status: AC
  Administered 2021-05-03: 1.5 mg/kg/h via INTRAVENOUS
  Filled 2021-05-02: qty 25

## 2021-05-02 MED ORDER — CHLORHEXIDINE GLUCONATE CLOTH 2 % EX PADS: 6.0000 | MEDICATED_PAD | Freq: Once | CUTANEOUS | Status: DC

## 2021-05-02 MED ORDER — MAGNESIUM SULFATE 50 % IJ SOLN
40.0000 meq | INTRAMUSCULAR | Status: DC
  Filled 2021-05-02: qty 9.85

## 2021-05-02 MED ORDER — CEFAZOLIN SODIUM-DEXTROSE 2-4 GM/100ML-% IV SOLN
2.0000 g | INTRAVENOUS | Status: DC
  Filled 2021-05-02: qty 100

## 2021-05-02 MED ORDER — NOREPINEPHRINE 4 MG/250ML-% IV SOLN
0.0000 ug/min | INTRAVENOUS | Status: DC
Start: 2021-05-03 — End: 2021-05-03
  Filled 2021-05-02: qty 250

## 2021-05-02 MED ORDER — EPINEPHRINE HCL 5 MG/250ML IV SOLN IN NS
0.0000 ug/min | INTRAVENOUS | Status: DC
  Filled 2021-05-02: qty 250

## 2021-05-02 MED ORDER — DIAZEPAM 5 MG PO TABS
5.0000 mg | ORAL_TABLET | Freq: Once | ORAL | Status: AC
  Administered 2021-05-03: 5 mg via ORAL
  Filled 2021-05-02: qty 1

## 2021-05-02 MED ORDER — HEPARIN 30,000 UNITS/1000 ML (OHS) CELLSAVER SOLUTION
Status: DC
  Filled 2021-05-02: qty 1000

## 2021-05-02 MED ORDER — CHLORHEXIDINE GLUCONATE 0.12 % MT SOLN
15.0000 mL | Freq: Once | OROMUCOSAL | Status: AC
  Administered 2021-05-03: 15 mL via OROMUCOSAL
  Filled 2021-05-02: qty 15

## 2021-05-02 MED ORDER — NITROGLYCERIN IN D5W 200-5 MCG/ML-% IV SOLN
2.0000 ug/min | INTRAVENOUS | Status: DC
  Filled 2021-05-02: qty 250

## 2021-05-02 NOTE — Progress Notes (Addendum)
Progress Note  Patient Name: Joseph Robles Date of Encounter: 05/02/2021  [facility] Cardiologist: Verne Carrow, MD   Subjective   No complaints this morning. Planned for CABG/MVR tomorrow.   Inpatient Medications    Scheduled Meds:  aspirin EC  81 mg Oral Daily   atorvastatin  80 mg Oral QHS   enoxaparin (LOVENOX) injection  40 mg Subcutaneous Q24H   [START ON 05/03/2021] epinephrine  0-10 mcg/min Intravenous To OR   feeding supplement  237 mL Oral TID BM   [START ON 05/03/2021] heparin-papaverine-plasmalyte irrigation   Irrigation To OR   insulin aspart  0-5 Units Subcutaneous QHS   insulin aspart  0-9 Units Subcutaneous TID WC   [START ON 05/03/2021] insulin   Intravenous To OR   [START ON 05/03/2021] magnesium sulfate  40 mEq Other To OR   multivitamin with minerals  1 tablet Oral Daily   [START ON 05/03/2021] phenylephrine  30-200 mcg/min Intravenous To OR   [START ON 05/03/2021] potassium chloride  80 mEq Other To OR   sodium chloride flush  3 mL Intravenous Q12H   [START ON 05/03/2021] tranexamic acid  15 mg/kg Intravenous To OR   [START ON 05/03/2021] tranexamic acid  2 mg/kg Intracatheter To OR   Continuous Infusions:  sodium chloride     [START ON 05/03/2021]  ceFAZolin (ANCEF) IV     [START ON 05/03/2021]  ceFAZolin (ANCEF) IV     [START ON 05/03/2021] dexmedetomidine     [START ON 05/03/2021] heparin 30,000 units/NS 1000 mL solution for CELLSAVER     [START ON 05/03/2021] milrinone     [START ON 05/03/2021] nitroGLYCERIN     [START ON 05/03/2021] norepinephrine     [START ON 05/03/2021] tranexamic acid (CYKLOKAPRON) infusion ([facility])     [START ON 05/03/2021] vancomycin     PRN Meds: sodium chloride, acetaminophen, Gerhardt's butt cream, ondansetron (ZOFRAN) IV, oxybutynin, sodium chloride flush   Vital Signs    Vitals:   05/01/21 1227 05/01/21 1528 05/01/21 2059 05/02/21 0435  BP: 121/67 129/85 114/88 110/68  Pulse: 80 74 60 60  Resp: 18 18 15 19   Temp: 98  F (36.7 C) 98.2 F (36.8 C) 98.4 F (36.9 C) 98.2 F (36.8 C)  TempSrc: Oral Oral Oral Oral  SpO2:  97% 98% 100%  Weight:      Height:        Intake/Output Summary (Last 24 hours) at 05/02/2021 1020 Last data filed at 05/02/2021 0436 Gross per 24 hour  Intake 3 ml  Output 3750 ml  Net -3747 ml   Last 3 Weights 04/28/2021  Weight (lbs) 150 lb  Weight (kg) 68.04 kg      Telemetry    SR - Personally Reviewed  ECG   No new tracing   Physical Exam   GEN: No acute distress.   Neck: No JVD Cardiac: RRR, 3/6 systolic murmur LLSB, no rubs, or gallops.  Respiratory: Clear to auscultation bilaterally. GI: Soft, nontender, non-distended  MS: No edema; No deformity. Neuro:  Nonfocal  Psych: Normal affect   Labs    High Sensitivity Troponin:  No results for input(s): TROPONINIHS in the last 720 hours.   Chemistry Recent Labs  Lab 04/27/21 0224 04/29/21 0250 04/30/21 0204  NA 130* 134* 133*  K 3.8 4.1 4.3  CL 98 99 98  CO2 24 29 27   GLUCOSE 135* 101* 148*  BUN 12 15 18   CREATININE 0.78 0.89 1.02  CALCIUM 8.8* 9.2 9.2  GFRNONAA >60 >60 >60  ANIONGAP 8 6 8     Lipids  Recent Labs  Lab 04/27/21 0224  CHOL 143  TRIG 57  HDL 47  LDLCALC 85  CHOLHDL 3.0    Hematology Recent Labs  Lab 04/27/21 0224 04/29/21 0250 04/30/21 0204  WBC 8.3 6.5 7.1  RBC 4.21* 4.42 4.37  HGB 12.5* 12.9* 13.1  HCT 36.7* 38.9* 38.2*  MCV 87.2 88.0 87.4  MCH 29.7 29.2 30.0  MCHC 34.1 33.2 34.3  RDW 14.2 13.9 13.9  PLT 229 230 230   Thyroid No results for input(s): TSH, FREET4 in the last 168 hours.  BNPNo results for input(s): BNP, PROBNP in the last 168 hours.  DDimer No results for input(s): DDIMER in the last 168 hours.   Radiology    VAS 05/02/21 DOPPLER PRE CABG  Result Date: 05/01/2021 PREOPERATIVE VASCULAR EVALUATION Patient Name:  Joseph Robles  Date of Exam:   04/30/2021 Medical Rec #: 05/02/2021    Accession #:    [id number] Date of Birth: 12-20-1950    Patient Gender: M  Patient Age:   69 years Exam Location:  [facility] Procedure:      VAS [facility] DOPPLER PRE CABG Referring Phys: HARRELL LIGHTFOOT --------------------------------------------------------------------------------  Indications:   Pre-CABG. Risk Factors:  Hyperlipidemia, Diabetes, current smoker. Other Factors: Severe mitral regurgitation. Performing Technologist: [place] RDMS, RVT Supporting Technologist: Jean Rosenthal RVS  Examination Guidelines: A complete evaluation includes B-mode imaging, spectral Doppler, color Doppler, and power Doppler as needed of all accessible portions of each vessel. Bilateral testing is considered an integral part of a complete examination. Limited examinations for reoccurring indications may be performed as noted.  Right Carotid Findings: +----------+--------+--------+--------+--------+------------------+           PSV cm/sEDV cm/sStenosisDescribeComments           +----------+--------+--------+--------+--------+------------------+ CCA Prox  111     16                                         +----------+--------+--------+--------+--------+------------------+ CCA Distal85      16                      intimal thickening +----------+--------+--------+--------+--------+------------------+ ICA Prox  63      17      1-39%                              +----------+--------+--------+--------+--------+------------------+ ICA Distal95      29                                         +----------+--------+--------+--------+--------+------------------+ ECA       75      12                                         +----------+--------+--------+--------+--------+------------------+ +----------+--------+-------+----------------+------------+           PSV cm/sEDV cmsDescribe        Arm Pressure +----------+--------+-------+----------------+------------+ Subclavian129            Multiphasic, WNL              +----------+--------+-------+----------------+------------+ +---------+--------+--+--------+--+---------+  VertebralPSV cm/s49EDV cm/s12Antegrade +---------+--------+--+--------+--+---------+ Left Carotid Findings: +----------+--------+--------+--------+-----------------+------------------+           PSV cm/sEDV cm/sStenosisDescribe         Comments           +----------+--------+--------+--------+-----------------+------------------+ CCA Prox  104     18                                                  +----------+--------+--------+--------+-----------------+------------------+ CCA Distal88      22                               intimal thickening +----------+--------+--------+--------+-----------------+------------------+ ICA Prox  65      17      1-39%   mild heterogenous                   +----------+--------+--------+--------+-----------------+------------------+ ICA Distal78      27                                                  +----------+--------+--------+--------+-----------------+------------------+ ECA       103     13                                                  +----------+--------+--------+--------+-----------------+------------------+  +----------+--------+--------+----------------+------------+ SubclavianPSV cm/sEDV cm/sDescribe        Arm Pressure +----------+--------+--------+----------------+------------+           99              Multiphasic, WNL             +----------+--------+--------+----------------+------------+ +---------+--------+--+--------+--+---------+ VertebralPSV cm/s48EDV cm/s10Antegrade +---------+--------+--+--------+--+---------+  Summary: Right Carotid: Velocities in the right ICA are consistent with a 1-39% stenosis. Left Carotid: Velocities in the left ICA are consistent with a 1-39% stenosis. Vertebrals:  Bilateral vertebral arteries demonstrate antegrade flow. Subclavians: Normal flow hemodynamics were  seen in bilateral subclavian              arteries.  Electronically signed by Gerarda Fraction on 05/01/2021 at 5:44:33 PM.    Final     Cardiac Studies   Cath: 04/26/21   Prox RCA to Mid RCA lesion is 100% stenosed.   Prox RCA lesion is 70% stenosed.   Prox Cx to Mid Cx lesion is 80% stenosed.   Mid LAD to Dist LAD lesion is 30% stenosed.   LV end diastolic pressure is normal.   Chronic total occlusion of the mid RCA. The mid and distal vessel fills from left to right collaterals.  Severe proximal Circumflex stenosis Mild mid LAD stenosis Large V waves suggestive of severe mitral regurgitation. (Echo with severe MR)   Recommendations: He will be admitted to our cardiology service. Continue workup for potential mitral valve surgery and CABG. He will likely need a TEE. Will ask CT surgery to see him after the TEE.    Diagnostic Dominance: Right   TEE: 04/28/21  IMPRESSIONS     1. Hypokinesis with aneurysmal dilation of inferior base. . Left  ventricular ejection  fraction, by estimation, is 55%%. The left ventricle  has normal function.   2. Right ventricular systolic function is normal. The right ventricular  size is normal.   3. No left atrial/left atrial appendage thrombus was detected.   4. The posterior mitral leaflet is restriced in motion. There is a gap  between P2/P3 segment with severe mitral regurgitation. MR directed  posterior and around LA into R and L upper pulmonary veins. Vena contracta  is 1cm2 Regurgitant volume is 63 cc..  Severe mitral valve regurgitation, Carpentier Type IIIB   5. The aortic valve is tricuspid. Aortic valve regurgitation is trivial.   6. There is mild (Grade II) plaque of thoracic aorta.   FINDINGS   Left Ventricle: Hypokinesis with aneurysmal dilation of inferior base.  Left ventricular ejection fraction, by estimation, is 55%%. The left  ventricle has normal function. The left ventricular internal cavity size  was normal in size.    Right Ventricle: The right ventricular size is normal. Right vetricular  wall thickness was not assessed. Right ventricular systolic function is  normal.   Left Atrium: Left atrial size was normal in size. No left atrial/left  atrial appendage thrombus was detected.   Right Atrium: Right atrial size was normal in size.   Pericardium: There is no evidence of pericardial effusion.   Mitral Valve: The posterior mitral leaflet is restriced im motion. There  is a gap between P2/P3 segment with severe mitral regurgitation. MR  directed posterior and around LA into R and L upper pulmonary veins. Vena  contracta is 1cm2 Regurgitant volume is   63 cc. Severe mitral valve regurgitation.   Tricuspid Valve: The tricuspid valve is normal in structure. Tricuspid  valve regurgitation is trivial.   Aortic Valve: The aortic valve is tricuspid. Aortic valve regurgitation is  trivial.   Pulmonic Valve: The pulmonic valve was normal in structure. Pulmonic valve  regurgitation is trivial.   Aorta: The aortic root is normal in size and structure. There is mild  (Grade II) plaque.   IAS/Shunts: The interatrial septum was not assessed.   Patient Profile     70 y.o. male  with PMH HLD, DM type II, and tobacco abuse who is being seen 04/26/2021 for the evaluation of severe mitral regurgitation.  Assessment & Plan    Severe mitral regurgitation with syncope: Patient presented after a syncopal episode resulting in laceration to posterior head.  He had no prodromal chest pain, palpitations, diaphoresis, nausea, vomiting, shortness of breath.  Echocardiogram at [facility] showed EF 55-60%, basilar inferior akinesis with aneurysmal wall, and severe MR.  He was transferred to [facility] for further work up. Cath noted above, large V waves suggestive of severe mitral regurg. TEE 9/23 with severe MR. Seen by TCTS with plans for CABG + MVR (repair vs replacement) 9/28   CAD: Underwent cardiac cath  noted above with pRCA 70% mRCA 100% with left to right collaterals, pLcx 80%. As above, CABG with MVR -- on ASA, statin  HLD: LDL 85 -- increased atorvastatin to  daily on admission -- FLP/LFTs in 8 weeks   DM type 2: A1c 7.0 on lipid at [facility]. On metformin and januvia at home -- Continue ISS this admission   Scalp laceration: 6 sutures to posterior scalp placed while at [place] -- ok to remove today  Chronic Suprapubic cath: evaluated by urology with catheter changed out. Plan for change out again in 2 weeks. He will establish with urology here, previous at  [facility]  For questions or updates, please contact [facility] Please consult [email or web address] for contact info under        Signed, Laverda Page, NP  05/02/2021, 10:20 AM     Patient seen and examined. Agree with assessment and plan. No chest pain or shortness of breath.  Blood pressure 110/70, rhythm stable.  No change in previously documented cardiac murmurs.  No edema patient will undergo mitral valve surgery and CABG tomorrow with Dr. Dorris Fetch.  Hopefully mitral valve repair rather than replacement will be able to be performed.   Lennette Bihari, MD, [facility] 05/02/2021 1:07 PM

## 2021-05-02 NOTE — Progress Notes (Signed)
Nutrition Follow-up  DOCUMENTATION CODES:   Severe malnutrition in context of chronic illness  INTERVENTION:   Glucerna Shake po TID, each supplement provides 220 kcal and 10 grams of protein. D/C Ensure Enlive per patient request. Continue MVI with minerals daily. Send snacks TID between meals.  NUTRITION DIAGNOSIS:   Severe Malnutrition related to chronic illness (CAD) as evidenced by severe muscle depletion, severe fat depletion.  Ongoing   GOAL:   Patient will meet greater than or equal to 90% of their needs  Progressing   MONITOR:   PO intake, Supplement acceptance, Labs  REASON FOR ASSESSMENT:   Malnutrition Screening Tool    ASSESSMENT:   70 year old male who presented from [facility] on [facility]/21 with severe MR. PMH of tobacco abuse, HLD, T2DM, urethral stricture managed with a suprapubic catheter.  Patient reports good intake of meals. He is concerned about following a heart healthy diet at home, knowing that he also needs additional calories and protein to prevent further weight loss. Reviewed ways to increase calorie and protein intake while maintaining a heart healthy diet. Provided "Heart Failure Nutrition Therapy for the Undernourished" handout to patient.  Plans for CABG tomorrow. Encouraged patient to continue eating well and taking his supplements and snacks to promote healing and recovery after surgery. Patient is consuming 100% of meals. He does not drink the Ensure supplements because he thinks it made his blood sugar high. Will change to Glucerna shakes, however, Glucerna shakes do not contain as many calories and have half the protein of an Ensure Plus/Enlive. Will also add snacks TID between meals to increase oral intake.    Labs and medications reviewed.  CBG's: 165-195 today  Patient meets criteria for severe malnutrition, given severe depletion of muscle and subcutaneous fat mass.   NUTRITION - FOCUSED PHYSICAL EXAM:  Flowsheet Row Most  Recent Value  Orbital Region Severe depletion  Upper Arm Region Severe depletion  Thoracic and Lumbar Region Severe depletion  Buccal Region Severe depletion  Temple Region Severe depletion  Clavicle Bone Region Severe depletion  Clavicle and Acromion Bone Region Severe depletion  Scapular Bone Region Severe depletion  Dorsal Hand Moderate depletion  Patellar Region Moderate depletion  Anterior Thigh Region Moderate depletion  Posterior Calf Region Moderate depletion  Edema (RD Assessment) None  Hair Reviewed  Eyes Reviewed  Mouth Reviewed  Skin Reviewed  Nails Reviewed       Diet Order:   Diet Order             Diet Carb Modified Fluid consistency: Thin; Room service appropriate? Yes  Diet effective now                   EDUCATION NEEDS:   Education needs have been addressed  Skin:  Skin Assessment: Skin Integrity Issues: Skin Integrity Issues:: Other (Comment) Other: laceration posterior head  Last BM:  04/28/21  Height:   Ht Readings from Last 1 Encounters:  04/28/21 6' (1.829 m)    Weight:   Wt Readings from Last 1 Encounters:  04/28/21 68 kg    BMI:  Body mass index is 20.34 kg/m.  Estimated Nutritional Needs:   Kcal:  2050-2250  Protein:  95-115 grams  Fluid:  >/= 2.0 L    Gabriel Rainwater, RD, LDN, CNSC Please refer to Amion for contact information.

## 2021-05-02 NOTE — Anesthesia Preprocedure Evaluation (Addendum)
Anesthesia Evaluation  Patient identified by MRN, date of birth, ID band Patient awake    Reviewed: Allergy & Precautions, NPO status , Patient's Chart, lab work & pertinent test results  History of Anesthesia Complications Negative for: history of anesthetic complications  Airway Mallampati: I  TM Distance: >3 FB Neck ROM: Full    Dental  (+) Edentulous Upper, Edentulous Lower   Pulmonary Current Smoker and Patient abstained from smoking.,    Pulmonary exam normal        Cardiovascular + CAD  + Valvular Problems/Murmurs MR  Rhythm:Regular Rate:Normal + Systolic murmurs  '22 TEE - Hypokinesis with aneurysmal dilation of inferior base. EF 55%%. The posterior mitral leaflet is restriced in motion. There is a gap between P2/P3 segment with severe mitral regurgitation. MR directed posterior and around LA into R and L upper pulmonary veins. Vena contracta is 1cm2 Regurgitant volume is 63 cc. Severe mitral valve regurgitation, Carpentier Type IIIB. Trivial AI. There is mild (Grade II) plaque of thoracic aorta  '22 Cath - Prox RCA to Mid RCA lesion is 100% stenosed. .  Prox RCA lesion is 70% stenosed. .  Prox Cx to Mid Cx lesion is 80% stenosed. .  Mid LAD to Dist LAD lesion is 30% stenosed.  '22 Carotid US - 1-39% ICAS b/l     Neuro/Psych negative neurological ROS  negative psych ROS   GI/Hepatic negative GI ROS, Neg liver ROS,   Endo/Other  diabetes, Type 2, Oral Hypoglycemic Agents Na 133   Renal/GU negative Renal ROS     Musculoskeletal negative musculoskeletal ROS (+)   Abdominal   Peds  Hematology negative hematology ROS (+)   Anesthesia Other Findings   Reproductive/Obstetrics                            Anesthesia Physical Anesthesia Plan  ASA: 4  Anesthesia Plan: General   Post-op Pain Management:    Induction: Intravenous  PONV Risk Score and Plan: 2 and Treatment may vary  due to age or medical condition  Airway Management Planned: Oral ETT  Additional Equipment: None, CVP, Arterial line, PA Cath, TEE and Ultrasound Guidance Line Placement  Intra-op Plan:   Post-operative Plan: Post-operative intubation/ventilation  Informed Consent: I have reviewed the patients History and Physical, chart, labs and discussed the procedure including the risks, benefits and alternatives for the proposed anesthesia with the patient or authorized representative who has indicated his/her understanding and acceptance.     Dental advisory given  Plan Discussed with: CRNA and Anesthesiologist  Anesthesia Plan Comments:        Anesthesia Quick Evaluation

## 2021-05-03 ENCOUNTER — Inpatient Hospital Stay (HOSPITAL_COMMUNITY): Payer: Medicare Other

## 2021-05-03 ENCOUNTER — Inpatient Hospital Stay (HOSPITAL_COMMUNITY): Payer: Medicare Other | Admitting: Certified Registered Nurse Anesthetist

## 2021-05-03 ENCOUNTER — Encounter (HOSPITAL_COMMUNITY): Admission: AD | Disposition: A | Discharge status: Home Health | Payer: Self-pay | Attending: Interventional Cardiology

## 2021-05-03 DIAGNOSIS — I2510 Atherosclerotic heart disease of native coronary artery without angina pectoris: Secondary | ICD-10-CM | POA: Diagnosis present

## 2021-05-03 DIAGNOSIS — Z9889 Other specified postprocedural states: Secondary | ICD-10-CM

## 2021-05-03 DIAGNOSIS — I340 Nonrheumatic mitral (valve) insufficiency: Secondary | ICD-10-CM | POA: Diagnosis not present

## 2021-05-03 DIAGNOSIS — Z951 Presence of aortocoronary bypass graft: Secondary | ICD-10-CM

## 2021-05-03 HISTORY — DX: Presence of aortocoronary bypass graft: Z95.1

## 2021-05-03 HISTORY — PX: ENDOVEIN HARVEST OF GREATER SAPHENOUS VEIN: SHX5059

## 2021-05-03 HISTORY — PX: MITRAL VALVE REPAIR: SHX2039

## 2021-05-03 HISTORY — PX: TEE WITHOUT CARDIOVERSION: SHX5443

## 2021-05-03 HISTORY — PX: CORONARY ARTERY BYPASS GRAFT: SHX141

## 2021-05-03 HISTORY — DX: Atherosclerotic heart disease of native coronary artery without angina pectoris: I25.10

## 2021-05-03 HISTORY — DX: Other specified postprocedural states: Z98.890

## 2021-05-03 LAB — POCT I-STAT EG7
Acid-Base Excess: 2 mmol/L (ref 0.0–2.0)
Bicarbonate: 27.5 mmol/L (ref 20.0–28.0)
Calcium, Ion: 1 mmol/L — ABNORMAL LOW (ref 1.15–1.40)
HCT: 26 % — ABNORMAL LOW (ref 39.0–52.0)
Hemoglobin: 8.8 g/dL — ABNORMAL LOW (ref 13.0–17.0)
O2 Saturation: 86 %
Potassium: 4.5 mmol/L (ref 3.5–5.1)
Sodium: 135 mmol/L (ref 135–145)
TCO2: 29 mmol/L (ref 22–32)
pCO2, Ven: 49.3 mmHg (ref 44.0–60.0)
pH, Ven: 7.355 (ref 7.250–7.430)
pO2, Ven: 55 mmHg — ABNORMAL HIGH (ref 32.0–45.0)

## 2021-05-03 LAB — MAGNESIUM: Magnesium: 2.6 mg/dL — ABNORMAL HIGH (ref 1.7–2.4)

## 2021-05-03 LAB — COMPREHENSIVE METABOLIC PANEL
ALT: 20 U/L (ref 0–44)
AST: 16 U/L (ref 15–41)
Albumin: 3.4 g/dL — ABNORMAL LOW (ref 3.5–5.0)
Alkaline Phosphatase: 84 U/L (ref 38–126)
Anion gap: 8 (ref 5–15)
BUN: 24 mg/dL — ABNORMAL HIGH (ref 8–23)
CO2: 26 mmol/L (ref 22–32)
Calcium: 9.2 mg/dL (ref 8.9–10.3)
Chloride: 98 mmol/L (ref 98–111)
Creatinine, Ser: 0.86 mg/dL (ref 0.61–1.24)
GFR, Estimated: >60 mL/min (ref >60)
Glucose, Bld: 171 mg/dL — ABNORMAL HIGH (ref 70–99)
Potassium: 4.1 mmol/L (ref 3.5–5.1)
Sodium: 132 mmol/L — ABNORMAL LOW (ref 135–145)
Total Bilirubin: 0.7 mg/dL (ref 0.3–1.2)
Total Protein: 6.3 g/dL — ABNORMAL LOW (ref 6.5–8.1)

## 2021-05-03 LAB — ECHO INTRAOPERATIVE TEE
Height: 72 in
Weight: 2400 oz

## 2021-05-03 LAB — POCT I-STAT 7, (LYTES, BLD GAS, ICA,H+H)
Acid-Base Excess: 7 mmol/L — ABNORMAL HIGH (ref 0.0–2.0)
Acid-Base Excess: 4 mmol/L — ABNORMAL HIGH (ref 0.0–2.0)
Acid-Base Excess: 2 mmol/L (ref 0.0–2.0)
Acid-Base Excess: 2 mmol/L (ref 0.0–2.0)
Acid-Base Excess: 1 mmol/L (ref 0.0–2.0)
Acid-Base Excess: 2 mmol/L (ref 0.0–2.0)
Acid-Base Excess: 0 mmol/L (ref 0.0–2.0)
Acid-base deficit: 1 mmol/L (ref 0.0–2.0)
Acid-base deficit: 1 mmol/L (ref 0.0–2.0)
Bicarbonate: 32.6 mmol/L — ABNORMAL HIGH (ref 20.0–28.0)
Bicarbonate: 28.9 mmol/L — ABNORMAL HIGH (ref 20.0–28.0)
Bicarbonate: 28.8 mmol/L — ABNORMAL HIGH (ref 20.0–28.0)
Bicarbonate: 27.2 mmol/L (ref 20.0–28.0)
Bicarbonate: 25.9 mmol/L (ref 20.0–28.0)
Bicarbonate: 26 mmol/L (ref 20.0–28.0)
Bicarbonate: 24.7 mmol/L (ref 20.0–28.0)
Bicarbonate: 22.2 mmol/L (ref 20.0–28.0)
Bicarbonate: 23.3 mmol/L (ref 20.0–28.0)
Calcium, Ion: 1.22 mmol/L (ref 1.15–1.40)
Calcium, Ion: 1.01 mmol/L — ABNORMAL LOW (ref 1.15–1.40)
Calcium, Ion: 1.06 mmol/L — ABNORMAL LOW (ref 1.15–1.40)
Calcium, Ion: 1.04 mmol/L — ABNORMAL LOW (ref 1.15–1.40)
Calcium, Ion: 0.98 mmol/L — ABNORMAL LOW (ref 1.15–1.40)
Calcium, Ion: 1 mmol/L — ABNORMAL LOW (ref 1.15–1.40)
Calcium, Ion: 1.1 mmol/L — ABNORMAL LOW (ref 1.15–1.40)
Calcium, Ion: 1.07 mmol/L — ABNORMAL LOW (ref 1.15–1.40)
Calcium, Ion: 1.13 mmol/L — ABNORMAL LOW (ref 1.15–1.40)
HCT: 38 % — ABNORMAL LOW (ref 39.0–52.0)
HCT: 28 % — ABNORMAL LOW (ref 39.0–52.0)
HCT: 26 % — ABNORMAL LOW (ref 39.0–52.0)
HCT: 26 % — ABNORMAL LOW (ref 39.0–52.0)
HCT: 23 % — ABNORMAL LOW (ref 39.0–52.0)
HCT: 24 % — ABNORMAL LOW (ref 39.0–52.0)
HCT: 24 % — ABNORMAL LOW (ref 39.0–52.0)
HCT: 15 % — ABNORMAL LOW (ref 39.0–52.0)
HCT: 17 % — ABNORMAL LOW (ref 39.0–52.0)
Hemoglobin: 12.9 g/dL — ABNORMAL LOW (ref 13.0–17.0)
Hemoglobin: 9.5 g/dL — ABNORMAL LOW (ref 13.0–17.0)
Hemoglobin: 8.8 g/dL — ABNORMAL LOW (ref 13.0–17.0)
Hemoglobin: 8.8 g/dL — ABNORMAL LOW (ref 13.0–17.0)
Hemoglobin: 7.8 g/dL — ABNORMAL LOW (ref 13.0–17.0)
Hemoglobin: 8.2 g/dL — ABNORMAL LOW (ref 13.0–17.0)
Hemoglobin: 8.2 g/dL — ABNORMAL LOW (ref 13.0–17.0)
Hemoglobin: 5.1 g/dL — CL (ref 13.0–17.0)
Hemoglobin: 5.8 g/dL — CL (ref 13.0–17.0)
O2 Saturation: 100 %
O2 Saturation: 100 %
O2 Saturation: 100 %
O2 Saturation: 100 %
O2 Saturation: 100 %
O2 Saturation: 98 %
O2 Saturation: 99 %
O2 Saturation: 100 %
O2 Saturation: 99 %
Patient temperature: 34.9
Patient temperature: 100
Patient temperature: 100
Potassium: 4.2 mmol/L (ref 3.5–5.1)
Potassium: 4.4 mmol/L (ref 3.5–5.1)
Potassium: 4.5 mmol/L (ref 3.5–5.1)
Potassium: 4.5 mmol/L (ref 3.5–5.1)
Potassium: 4.5 mmol/L (ref 3.5–5.1)
Potassium: 4.7 mmol/L (ref 3.5–5.1)
Potassium: 3.9 mmol/L (ref 3.5–5.1)
Potassium: 4.2 mmol/L (ref 3.5–5.1)
Potassium: 4.4 mmol/L (ref 3.5–5.1)
Sodium: 136 mmol/L (ref 135–145)
Sodium: 137 mmol/L (ref 135–145)
Sodium: 139 mmol/L (ref 135–145)
Sodium: 138 mmol/L (ref 135–145)
Sodium: 137 mmol/L (ref 135–145)
Sodium: 138 mmol/L (ref 135–145)
Sodium: 139 mmol/L (ref 135–145)
Sodium: 138 mmol/L (ref 135–145)
Sodium: 137 mmol/L (ref 135–145)
TCO2: 34 mmol/L — ABNORMAL HIGH (ref 22–32)
TCO2: 30 mmol/L (ref 22–32)
TCO2: 30 mmol/L (ref 22–32)
TCO2: 28 mmol/L (ref 22–32)
TCO2: 27 mmol/L (ref 22–32)
TCO2: 27 mmol/L (ref 22–32)
TCO2: 26 mmol/L (ref 22–32)
TCO2: 23 mmol/L (ref 22–32)
TCO2: 24 mmol/L (ref 22–32)
pCO2 arterial: 49.9 mmHg — ABNORMAL HIGH (ref 32.0–48.0)
pCO2 arterial: 45.4 mmHg (ref 32.0–48.0)
pCO2 arterial: 53.6 mmHg — ABNORMAL HIGH (ref 32.0–48.0)
pCO2 arterial: 44.1 mmHg (ref 32.0–48.0)
pCO2 arterial: 36.6 mmHg (ref 32.0–48.0)
pCO2 arterial: 40 mmHg (ref 32.0–48.0)
pCO2 arterial: 41.1 mmHg (ref 32.0–48.0)
pCO2 arterial: 25 mmHg — ABNORMAL LOW (ref 32.0–48.0)
pCO2 arterial: 39.7 mmHg (ref 32.0–48.0)
pH, Arterial: 7.424 (ref 7.350–7.450)
pH, Arterial: 7.411 (ref 7.350–7.450)
pH, Arterial: 7.338 — ABNORMAL LOW (ref 7.350–7.450)
pH, Arterial: 7.398 (ref 7.350–7.450)
pH, Arterial: 7.42 (ref 7.350–7.450)
pH, Arterial: 7.458 — ABNORMAL HIGH (ref 7.350–7.450)
pH, Arterial: 7.377 (ref 7.350–7.450)
pH, Arterial: 7.56 — ABNORMAL HIGH (ref 7.350–7.450)
pH, Arterial: 7.38 (ref 7.350–7.450)
pO2, Arterial: 544 mmHg — ABNORMAL HIGH (ref 83.0–108.0)
pO2, Arterial: 402 mmHg — ABNORMAL HIGH (ref 83.0–108.0)
pO2, Arterial: 384 mmHg — ABNORMAL HIGH (ref 83.0–108.0)
pO2, Arterial: 358 mmHg — ABNORMAL HIGH (ref 83.0–108.0)
pO2, Arterial: 101 mmHg (ref 83.0–108.0)
pO2, Arterial: 293 mmHg — ABNORMAL HIGH (ref 83.0–108.0)
pO2, Arterial: 142 mmHg — ABNORMAL HIGH (ref 83.0–108.0)
pO2, Arterial: 196 mmHg — ABNORMAL HIGH (ref 83.0–108.0)
pO2, Arterial: 139 mmHg — ABNORMAL HIGH (ref 83.0–108.0)

## 2021-05-03 LAB — POCT I-STAT, CHEM 8
BUN: 20 mg/dL (ref 8–23)
BUN: 18 mg/dL (ref 8–23)
BUN: 16 mg/dL (ref 8–23)
BUN: 17 mg/dL (ref 8–23)
BUN: 15 mg/dL (ref 8–23)
BUN: 15 mg/dL (ref 8–23)
Calcium, Ion: 1.21 mmol/L (ref 1.15–1.40)
Calcium, Ion: 1.25 mmol/L (ref 1.15–1.40)
Calcium, Ion: 1.11 mmol/L — ABNORMAL LOW (ref 1.15–1.40)
Calcium, Ion: 1.11 mmol/L — ABNORMAL LOW (ref 1.15–1.40)
Calcium, Ion: 1.05 mmol/L — ABNORMAL LOW (ref 1.15–1.40)
Calcium, Ion: 1.08 mmol/L — ABNORMAL LOW (ref 1.15–1.40)
Chloride: 100 mmol/L (ref 98–111)
Chloride: 99 mmol/L (ref 98–111)
Chloride: 102 mmol/L (ref 98–111)
Chloride: 98 mmol/L (ref 98–111)
Chloride: 103 mmol/L (ref 98–111)
Chloride: 103 mmol/L (ref 98–111)
Creatinine, Ser: 0.6 mg/dL — ABNORMAL LOW (ref 0.61–1.24)
Creatinine, Ser: 0.7 mg/dL (ref 0.61–1.24)
Creatinine, Ser: 0.5 mg/dL — ABNORMAL LOW (ref 0.61–1.24)
Creatinine, Ser: 0.6 mg/dL — ABNORMAL LOW (ref 0.61–1.24)
Creatinine, Ser: 0.5 mg/dL — ABNORMAL LOW (ref 0.61–1.24)
Creatinine, Ser: 0.6 mg/dL — ABNORMAL LOW (ref 0.61–1.24)
Glucose, Bld: 163 mg/dL — ABNORMAL HIGH (ref 70–99)
Glucose, Bld: 160 mg/dL — ABNORMAL HIGH (ref 70–99)
Glucose, Bld: 106 mg/dL — ABNORMAL HIGH (ref 70–99)
Glucose, Bld: 118 mg/dL — ABNORMAL HIGH (ref 70–99)
Glucose, Bld: 107 mg/dL — ABNORMAL HIGH (ref 70–99)
Glucose, Bld: 145 mg/dL — ABNORMAL HIGH (ref 70–99)
HCT: 40 % (ref 39.0–52.0)
HCT: 34 % — ABNORMAL LOW (ref 39.0–52.0)
HCT: 24 % — ABNORMAL LOW (ref 39.0–52.0)
HCT: 26 % — ABNORMAL LOW (ref 39.0–52.0)
HCT: 23 % — ABNORMAL LOW (ref 39.0–52.0)
HCT: 24 % — ABNORMAL LOW (ref 39.0–52.0)
Hemoglobin: 13.6 g/dL (ref 13.0–17.0)
Hemoglobin: 11.6 g/dL — ABNORMAL LOW (ref 13.0–17.0)
Hemoglobin: 8.2 g/dL — ABNORMAL LOW (ref 13.0–17.0)
Hemoglobin: 8.8 g/dL — ABNORMAL LOW (ref 13.0–17.0)
Hemoglobin: 7.8 g/dL — ABNORMAL LOW (ref 13.0–17.0)
Hemoglobin: 8.2 g/dL — ABNORMAL LOW (ref 13.0–17.0)
Potassium: 4.2 mmol/L (ref 3.5–5.1)
Potassium: 4 mmol/L (ref 3.5–5.1)
Potassium: 4.5 mmol/L (ref 3.5–5.1)
Potassium: 4.6 mmol/L (ref 3.5–5.1)
Potassium: 4.8 mmol/L (ref 3.5–5.1)
Potassium: 4.5 mmol/L (ref 3.5–5.1)
Sodium: 135 mmol/L (ref 135–145)
Sodium: 135 mmol/L (ref 135–145)
Sodium: 135 mmol/L (ref 135–145)
Sodium: 138 mmol/L (ref 135–145)
Sodium: 136 mmol/L (ref 135–145)
Sodium: 136 mmol/L (ref 135–145)
TCO2: 30 mmol/L (ref 22–32)
TCO2: 28 mmol/L (ref 22–32)
TCO2: 28 mmol/L (ref 22–32)
TCO2: 29 mmol/L (ref 22–32)
TCO2: 27 mmol/L (ref 22–32)
TCO2: 23 mmol/L (ref 22–32)

## 2021-05-03 LAB — BASIC METABOLIC PANEL
Anion gap: 8 (ref 5–15)
Anion gap: 4 — ABNORMAL LOW (ref 5–15)
BUN: 22 mg/dL (ref 8–23)
BUN: 13 mg/dL (ref 8–23)
CO2: 26 mmol/L (ref 22–32)
CO2: 22 mmol/L (ref 22–32)
Calcium: 9 mg/dL (ref 8.9–10.3)
Calcium: 7.3 mg/dL — ABNORMAL LOW (ref 8.9–10.3)
Chloride: 98 mmol/L (ref 98–111)
Chloride: 108 mmol/L (ref 98–111)
Creatinine, Ser: 0.8 mg/dL (ref 0.61–1.24)
Creatinine, Ser: 0.64 mg/dL (ref 0.61–1.24)
GFR, Estimated: >60 mL/min (ref >60)
GFR, Estimated: >60 mL/min (ref >60)
Glucose, Bld: 158 mg/dL — ABNORMAL HIGH (ref 70–99)
Glucose, Bld: 116 mg/dL — ABNORMAL HIGH (ref 70–99)
Potassium: 4 mmol/L (ref 3.5–5.1)
Potassium: 4.1 mmol/L (ref 3.5–5.1)
Sodium: 132 mmol/L — ABNORMAL LOW (ref 135–145)
Sodium: 134 mmol/L — ABNORMAL LOW (ref 135–145)

## 2021-05-03 LAB — PROTIME-INR
INR: 1 (ref 0.8–1.2)
INR: 1.5 — ABNORMAL HIGH (ref 0.8–1.2)
Prothrombin Time: 13.5 seconds (ref 11.4–15.2)
Prothrombin Time: 18.5 seconds — ABNORMAL HIGH (ref 11.4–15.2)

## 2021-05-03 LAB — SURGICAL PCR SCREEN
MRSA, PCR: NEGATIVE
Staphylococcus aureus: NEGATIVE

## 2021-05-03 LAB — HEMOGLOBIN A1C
Hgb A1c MFr Bld: 7.1 % — ABNORMAL HIGH (ref 4.8–5.6)
Mean Plasma Glucose: 157 mg/dL

## 2021-05-03 LAB — PREPARE RBC (CROSSMATCH)

## 2021-05-03 LAB — GLUCOSE, CAPILLARY
Glucose-Capillary: 158 mg/dL — ABNORMAL HIGH (ref 70–99)
Glucose-Capillary: 144 mg/dL — ABNORMAL HIGH (ref 70–99)
Glucose-Capillary: 98 mg/dL (ref 70–99)
Glucose-Capillary: 96 mg/dL (ref 70–99)
Glucose-Capillary: 115 mg/dL — ABNORMAL HIGH (ref 70–99)
Glucose-Capillary: 89 mg/dL (ref 70–99)
Glucose-Capillary: 106 mg/dL — ABNORMAL HIGH (ref 70–99)
Glucose-Capillary: 124 mg/dL — ABNORMAL HIGH (ref 70–99)
Glucose-Capillary: 123 mg/dL — ABNORMAL HIGH (ref 70–99)
Glucose-Capillary: 140 mg/dL — ABNORMAL HIGH (ref 70–99)
Glucose-Capillary: 120 mg/dL — ABNORMAL HIGH (ref 70–99)

## 2021-05-03 LAB — CBC
HCT: 36.6 % — ABNORMAL LOW (ref 39.0–52.0)
HCT: 24.7 % — ABNORMAL LOW (ref 39.0–52.0)
HCT: 19.6 % — ABNORMAL LOW (ref 39.0–52.0)
Hemoglobin: 12.5 g/dL — ABNORMAL LOW (ref 13.0–17.0)
Hemoglobin: 8.4 g/dL — ABNORMAL LOW (ref 13.0–17.0)
Hemoglobin: 6.6 g/dL — CL (ref 13.0–17.0)
MCH: 29.7 pg (ref 26.0–34.0)
MCH: 30.5 pg (ref 26.0–34.0)
MCH: 29.6 pg (ref 26.0–34.0)
MCHC: 34.2 g/dL (ref 30.0–36.0)
MCHC: 34 g/dL (ref 30.0–36.0)
MCHC: 33.7 g/dL (ref 30.0–36.0)
MCV: 86.9 fL (ref 80.0–100.0)
MCV: 89.8 fL (ref 80.0–100.0)
MCV: 87.9 fL (ref 80.0–100.0)
Platelets: 230 10*3/uL (ref 150–400)
Platelets: 125 10*3/uL — ABNORMAL LOW (ref 150–400)
Platelets: 121 10*3/uL — ABNORMAL LOW (ref 150–400)
RBC: 4.21 MIL/uL — ABNORMAL LOW (ref 4.22–5.81)
RBC: 2.75 MIL/uL — ABNORMAL LOW (ref 4.22–5.81)
RBC: 2.23 MIL/uL — ABNORMAL LOW (ref 4.22–5.81)
RDW: 13.6 % (ref 11.5–15.5)
RDW: 13.7 % (ref 11.5–15.5)
RDW: 13.9 % (ref 11.5–15.5)
WBC: 6.3 10*3/uL (ref 4.0–10.5)
WBC: 11.2 10*3/uL — ABNORMAL HIGH (ref 4.0–10.5)
WBC: 15.9 10*3/uL — ABNORMAL HIGH (ref 4.0–10.5)
nRBC: 0 % (ref 0.0–0.2)
nRBC: 0 % (ref 0.0–0.2)
nRBC: 0 % (ref 0.0–0.2)

## 2021-05-03 LAB — BLOOD GAS, ARTERIAL
Acid-Base Excess: 2.2 mmol/L — ABNORMAL HIGH (ref 0.0–2.0)
Bicarbonate: 26 mmol/L (ref 20.0–28.0)
FIO2: 21
O2 Saturation: 98.9 %
Patient temperature: 37
pCO2 arterial: 38.8 mmHg (ref 32.0–48.0)
pH, Arterial: 7.441 (ref 7.350–7.450)
pO2, Arterial: 137 mmHg — ABNORMAL HIGH (ref 83.0–108.0)

## 2021-05-03 LAB — APTT
aPTT: 38 seconds — ABNORMAL HIGH (ref 24–36)
aPTT: 44 seconds — ABNORMAL HIGH (ref 24–36)

## 2021-05-03 LAB — FIBRINOGEN: Fibrinogen: 185 mg/dL — ABNORMAL LOW (ref 210–475)

## 2021-05-03 LAB — PLATELET COUNT: Platelets: 126 10*3/uL — ABNORMAL LOW (ref 150–400)

## 2021-05-03 LAB — HEMOGLOBIN AND HEMATOCRIT, BLOOD
HCT: 24.1 % — ABNORMAL LOW (ref 39.0–52.0)
HCT: 20.2 % — ABNORMAL LOW (ref 39.0–52.0)
Hemoglobin: 8.2 g/dL — ABNORMAL LOW (ref 13.0–17.0)
Hemoglobin: 6.8 g/dL — CL (ref 13.0–17.0)

## 2021-05-03 SURGERY — CORONARY ARTERY BYPASS GRAFTING (CABG)
Anesthesia: General | Site: Leg Upper | Laterality: Right

## 2021-05-03 MED ORDER — FENTANYL CITRATE (PF) 250 MCG/5ML IJ SOLN
INTRAMUSCULAR | Status: AC
  Filled 2021-05-03: qty 25

## 2021-05-03 MED ORDER — ACETAMINOPHEN 160 MG/5ML PO SOLN: 1000.0000 mg | Freq: Four times a day (QID) | ORAL | Status: AC

## 2021-05-03 MED ORDER — SODIUM CHLORIDE 0.9% FLUSH: 3.0000 mL | INTRAVENOUS | Status: DC | PRN

## 2021-05-03 MED ORDER — ARTIFICIAL TEARS OPHTHALMIC OINT
TOPICAL_OINTMENT | OPHTHALMIC | Status: DC | PRN
  Administered 2021-05-03: 1 via OPHTHALMIC

## 2021-05-03 MED ORDER — SODIUM CHLORIDE 0.9 % IV SOLN
250.0000 mL | INTRAVENOUS | Status: DC
  Administered 2021-05-04: 250 mL via INTRAVENOUS

## 2021-05-03 MED ORDER — PROTAMINE SULFATE 10 MG/ML IV SOLN
25.0000 mg | Freq: Once | INTRAVENOUS | Status: AC
  Administered 2021-05-03: 25 mg via INTRAVENOUS
  Filled 2021-05-03: qty 5

## 2021-05-03 MED ORDER — LACTATED RINGERS IV SOLN: INTRAVENOUS | Status: DC | PRN

## 2021-05-03 MED ORDER — VANCOMYCIN HCL IN DEXTROSE 1-5 GM/200ML-% IV SOLN
1000.0000 mg | Freq: Once | INTRAVENOUS | Status: AC
  Administered 2021-05-03: 1000 mg via INTRAVENOUS
  Filled 2021-05-03: qty 200

## 2021-05-03 MED ORDER — MIDAZOLAM HCL 2 MG/2ML IJ SOLN
INTRAMUSCULAR | Status: AC
  Filled 2021-05-03: qty 2

## 2021-05-03 MED ORDER — MIDAZOLAM HCL 2 MG/2ML IJ SOLN
2.0000 mg | INTRAMUSCULAR | Status: DC | PRN
Start: 2021-05-03 — End: 2021-05-05

## 2021-05-03 MED ORDER — EPHEDRINE SULFATE-NACL 50-0.9 MG/10ML-% IV SOSY
PREFILLED_SYRINGE | INTRAVENOUS | Status: DC | PRN
  Administered 2021-05-03: 10 mg via INTRAVENOUS

## 2021-05-03 MED ORDER — PLASMA-LYTE A IV SOLN
INTRAVENOUS | Status: DC | PRN
  Administered 2021-05-03: 1000 mL via INTRAVASCULAR

## 2021-05-03 MED ORDER — INSULIN REGULAR(HUMAN) IN NACL 100-0.9 UT/100ML-% IV SOLN: INTRAVENOUS | Status: DC

## 2021-05-03 MED ORDER — ORAL CARE MOUTH RINSE
15.0000 mL | OROMUCOSAL | Status: DC
  Administered 2021-05-03 – 2021-05-05 (×14): 15 mL via OROMUCOSAL

## 2021-05-03 MED ORDER — ONDANSETRON HCL 4 MG/2ML IJ SOLN
4.0000 mg | Freq: Four times a day (QID) | INTRAMUSCULAR | Status: DC | PRN
  Administered 2021-05-03 – 2021-05-05 (×2): 4 mg via INTRAVENOUS
  Filled 2021-05-03 (×2): qty 2

## 2021-05-03 MED ORDER — DEXTROSE 50 % IV SOLN: 0.0000 mL | INTRAVENOUS | Status: DC | PRN

## 2021-05-03 MED ORDER — MIDAZOLAM HCL (PF) 10 MG/2ML IJ SOLN
INTRAMUSCULAR | Status: AC
  Filled 2021-05-03: qty 2

## 2021-05-03 MED ORDER — PROPOFOL 10 MG/ML IV BOLUS
INTRAVENOUS | Status: AC
  Filled 2021-05-03: qty 40

## 2021-05-03 MED ORDER — PROTAMINE SULFATE 10 MG/ML IV SOLN
INTRAVENOUS | Status: DC | PRN
  Administered 2021-05-03: 20 mg via INTRAVENOUS
  Administered 2021-05-03: 220 mg via INTRAVENOUS

## 2021-05-03 MED ORDER — SODIUM CHLORIDE 0.9 % IV BOLUS: 250.0000 mL | Freq: Once | INTRAVENOUS | Status: DC

## 2021-05-03 MED ORDER — TRAMADOL HCL 50 MG PO TABS: 50.0000 mg | ORAL_TABLET | ORAL | Status: DC | PRN

## 2021-05-03 MED ORDER — SODIUM CHLORIDE 0.9% FLUSH
10.0000 mL | Freq: Two times a day (BID) | INTRAVENOUS | Status: DC
  Administered 2021-05-04 (×2): 10 mL

## 2021-05-03 MED ORDER — ARTIFICIAL TEARS OPHTHALMIC OINT
TOPICAL_OINTMENT | OPHTHALMIC | Status: AC
  Filled 2021-05-03: qty 3.5

## 2021-05-03 MED ORDER — ASPIRIN EC 325 MG PO TBEC
325.0000 mg | DELAYED_RELEASE_TABLET | Freq: Every day | ORAL | Status: DC
  Administered 2021-05-04 – 2021-05-09 (×6): 325 mg via ORAL
  Filled 2021-05-03 (×6): qty 1

## 2021-05-03 MED ORDER — ACETAMINOPHEN 500 MG PO TABS
1000.0000 mg | ORAL_TABLET | Freq: Four times a day (QID) | ORAL | Status: AC
  Administered 2021-05-03 – 2021-05-07 (×16): 1000 mg via ORAL
  Filled 2021-05-03 (×17): qty 2

## 2021-05-03 MED ORDER — PROPOFOL 10 MG/ML IV BOLUS
INTRAVENOUS | Status: DC | PRN
  Administered 2021-05-03 (×2): 50 mg via INTRAVENOUS

## 2021-05-03 MED ORDER — SODIUM CHLORIDE 0.9 % IV SOLN
INTRAVENOUS | Status: AC | PRN
  Administered 2021-05-03: 1000 mL via INTRAMUSCULAR

## 2021-05-03 MED ORDER — ALBUMIN HUMAN 5 % IV SOLN
250.0000 mL | INTRAVENOUS | Status: DC | PRN
Start: 2021-05-03 — End: 2021-05-03

## 2021-05-03 MED ORDER — SODIUM CHLORIDE 0.9 % IV SOLN
INTRAVENOUS | Status: DC
Start: 2021-05-03 — End: 2021-05-05

## 2021-05-03 MED ORDER — HEMOSTATIC AGENTS (NO CHARGE) OPTIME
TOPICAL | Status: DC | PRN
  Administered 2021-05-03 (×2): 1 via TOPICAL

## 2021-05-03 MED ORDER — ALBUMIN HUMAN 5 % IV SOLN
INTRAVENOUS | Status: AC
  Administered 2021-05-03: 12.5 g via INTRAVENOUS
  Filled 2021-05-03: qty 250

## 2021-05-03 MED ORDER — ALBUMIN HUMAN 5 % IV SOLN: 12.5000 g | Freq: Once | INTRAVENOUS | Status: AC

## 2021-05-03 MED ORDER — ALBUMIN HUMAN 5 % IV SOLN
INTRAVENOUS | Status: DC | PRN
  Administered 2021-05-03: 12.5 g via INTRAVENOUS

## 2021-05-03 MED ORDER — ROCURONIUM BROMIDE 10 MG/ML (PF) SYRINGE
PREFILLED_SYRINGE | INTRAVENOUS | Status: DC | PRN
  Administered 2021-05-03: 20 mg via INTRAVENOUS
  Administered 2021-05-03: 50 mg via INTRAVENOUS
  Administered 2021-05-03: 100 mg via INTRAVENOUS
  Administered 2021-05-03: 50 mg via INTRAVENOUS

## 2021-05-03 MED ORDER — ALBUMIN HUMAN 25 % IV SOLN
12.5000 g | INTRAVENOUS | Status: DC | PRN
  Filled 2021-05-03: qty 50

## 2021-05-03 MED ORDER — POTASSIUM CHLORIDE 10 MEQ/50ML IV SOLN
10.0000 meq | INTRAVENOUS | Status: AC
  Administered 2021-05-03 (×2): 10 meq via INTRAVENOUS

## 2021-05-03 MED ORDER — LACTATED RINGERS IV SOLN: INTRAVENOUS | Status: DC

## 2021-05-03 MED ORDER — MIDAZOLAM HCL 5 MG/5ML IJ SOLN
INTRAMUSCULAR | Status: DC | PRN
  Administered 2021-05-03 (×5): 1 mg via INTRAVENOUS
  Administered 2021-05-03: 4 mg via INTRAVENOUS
  Administered 2021-05-03: 1 mg via INTRAVENOUS
  Administered 2021-05-03: 2 mg via INTRAVENOUS
  Administered 2021-05-03 (×2): 1 mg via INTRAVENOUS

## 2021-05-03 MED ORDER — NITROGLYCERIN IN D5W 200-5 MCG/ML-% IV SOLN
0.0000 ug/min | INTRAVENOUS | Status: DC
  Filled 2021-05-03: qty 250

## 2021-05-03 MED ORDER — HEPARIN SODIUM (PORCINE) 1000 UNIT/ML IJ SOLN
INTRAMUSCULAR | Status: DC | PRN
  Administered 2021-05-03: 2000 [IU] via INTRAVENOUS
  Administered 2021-05-03: 22000 [IU] via INTRAVENOUS

## 2021-05-03 MED ORDER — DEXMEDETOMIDINE HCL IN NACL 400 MCG/100ML IV SOLN
0.0000 ug/kg/h | INTRAVENOUS | Status: DC
  Administered 2021-05-03: 0.4 ug/kg/h via INTRAVENOUS
  Filled 2021-05-03: qty 100

## 2021-05-03 MED ORDER — LACTATED RINGERS IV SOLN
500.0000 mL | Freq: Once | INTRAVENOUS | Status: AC | PRN
  Administered 2021-05-03: 500 mL via INTRAVENOUS

## 2021-05-03 MED ORDER — FENTANYL CITRATE (PF) 250 MCG/5ML IJ SOLN
INTRAMUSCULAR | Status: DC | PRN
  Administered 2021-05-03: 75 ug via INTRAVENOUS
  Administered 2021-05-03 (×2): 50 ug via INTRAVENOUS
  Administered 2021-05-03: 100 ug via INTRAVENOUS
  Administered 2021-05-03: 25 ug via INTRAVENOUS
  Administered 2021-05-03 (×2): 50 ug via INTRAVENOUS
  Administered 2021-05-03: 100 ug via INTRAVENOUS
  Administered 2021-05-03: 50 ug via INTRAVENOUS
  Administered 2021-05-03: 100 ug via INTRAVENOUS
  Administered 2021-05-03: 50 ug via INTRAVENOUS
  Administered 2021-05-03: 100 ug via INTRAVENOUS
  Administered 2021-05-03 (×2): 50 ug via INTRAVENOUS
  Administered 2021-05-03: 100 ug via INTRAVENOUS
  Administered 2021-05-03 (×2): 50 ug via INTRAVENOUS

## 2021-05-03 MED ORDER — DOPAMINE-DEXTROSE 3.2-5 MG/ML-% IV SOLN
INTRAVENOUS | Status: DC | PRN
  Administered 2021-05-03: 2 ug/kg/min via INTRAVENOUS

## 2021-05-03 MED ORDER — SODIUM CHLORIDE 0.9% IV SOLUTION: Freq: Once | INTRAVENOUS | Status: DC

## 2021-05-03 MED ORDER — CEFAZOLIN SODIUM-DEXTROSE 2-4 GM/100ML-% IV SOLN
2.0000 g | Freq: Three times a day (TID) | INTRAVENOUS | Status: AC
  Administered 2021-05-03 – 2021-05-05 (×6): 2 g via INTRAVENOUS
  Filled 2021-05-03 (×6): qty 100

## 2021-05-03 MED ORDER — SODIUM CHLORIDE 0.9 % IV SOLN
INTRAVENOUS | Status: DC | PRN
Start: 2021-05-03 — End: 2021-05-03

## 2021-05-03 MED ORDER — SODIUM CHLORIDE 0.9% FLUSH
3.0000 mL | Freq: Two times a day (BID) | INTRAVENOUS | Status: DC
  Administered 2021-05-04 (×2): 3 mL via INTRAVENOUS

## 2021-05-03 MED ORDER — ACETAMINOPHEN 160 MG/5ML PO SOLN: 650.0000 mg | Freq: Once | ORAL | Status: AC

## 2021-05-03 MED ORDER — SODIUM CHLORIDE 0.9% FLUSH: 10.0000 mL | INTRAVENOUS | Status: DC | PRN

## 2021-05-03 MED ORDER — MAGNESIUM SULFATE 4 GM/100ML IV SOLN
4.0000 g | Freq: Once | INTRAVENOUS | Status: AC
  Administered 2021-05-03: 4 g via INTRAVENOUS
  Filled 2021-05-03: qty 100

## 2021-05-03 MED ORDER — PANTOPRAZOLE SODIUM 40 MG PO TBEC
40.0000 mg | DELAYED_RELEASE_TABLET | Freq: Every day | ORAL | Status: DC
  Administered 2021-05-05 – 2021-05-09 (×5): 40 mg via ORAL
  Filled 2021-05-03 (×5): qty 1

## 2021-05-03 MED ORDER — DOPAMINE-DEXTROSE 3.2-5 MG/ML-% IV SOLN: 0.0000 ug/kg/min | INTRAVENOUS | Status: DC

## 2021-05-03 MED ORDER — METOPROLOL TARTRATE 12.5 MG HALF TABLET
12.5000 mg | ORAL_TABLET | Freq: Two times a day (BID) | ORAL | Status: DC
  Administered 2021-05-05 – 2021-05-08 (×8): 12.5 mg via ORAL
  Filled 2021-05-03 (×8): qty 1

## 2021-05-03 MED ORDER — CHLORHEXIDINE GLUCONATE 0.12 % MT SOLN
15.0000 mL | OROMUCOSAL | Status: AC
  Administered 2021-05-03: 15 mL via OROMUCOSAL

## 2021-05-03 MED ORDER — ALBUMIN HUMAN 5 % IV SOLN
250.0000 mL | INTRAVENOUS | Status: AC | PRN
Start: 2021-05-03 — End: 2021-05-03
  Administered 2021-05-03 (×4): 12.5 g via INTRAVENOUS
  Filled 2021-05-03: qty 250

## 2021-05-03 MED ORDER — DOCUSATE SODIUM 100 MG PO CAPS
200.0000 mg | ORAL_CAPSULE | Freq: Every day | ORAL | Status: DC
  Administered 2021-05-04 – 2021-05-08 (×5): 200 mg via ORAL
  Filled 2021-05-03 (×6): qty 2

## 2021-05-03 MED ORDER — BISACODYL 5 MG PO TBEC
10.0000 mg | DELAYED_RELEASE_TABLET | Freq: Every day | ORAL | Status: DC
  Administered 2021-05-04 – 2021-05-07 (×3): 10 mg via ORAL
  Filled 2021-05-03 (×4): qty 2

## 2021-05-03 MED ORDER — MORPHINE SULFATE (PF) 2 MG/ML IV SOLN
1.0000 mg | INTRAVENOUS | Status: DC | PRN
  Administered 2021-05-03 – 2021-05-04 (×4): 2 mg via INTRAVENOUS
  Filled 2021-05-03 (×4): qty 1

## 2021-05-03 MED ORDER — ROCURONIUM BROMIDE 10 MG/ML (PF) SYRINGE
PREFILLED_SYRINGE | INTRAVENOUS | Status: AC
  Filled 2021-05-03: qty 10

## 2021-05-03 MED ORDER — OXYCODONE HCL 5 MG PO TABS
5.0000 mg | ORAL_TABLET | ORAL | Status: DC | PRN
  Administered 2021-05-05 – 2021-05-09 (×9): 10 mg via ORAL
  Filled 2021-05-03 (×9): qty 2

## 2021-05-03 MED ORDER — ACETAMINOPHEN 650 MG RE SUPP
650.0000 mg | Freq: Once | RECTAL | Status: AC
  Administered 2021-05-03: 650 mg via RECTAL

## 2021-05-03 MED ORDER — METOPROLOL TARTRATE 25 MG/10 ML ORAL SUSPENSION: 12.5000 mg | Freq: Two times a day (BID) | ORAL | Status: DC

## 2021-05-03 MED ORDER — PHENYLEPHRINE 40 MCG/ML (10ML) SYRINGE FOR IV PUSH (FOR BLOOD PRESSURE SUPPORT)
PREFILLED_SYRINGE | INTRAVENOUS | Status: DC | PRN
  Administered 2021-05-03 (×2): 40 ug via INTRAVENOUS
  Administered 2021-05-03: 80 ug via INTRAVENOUS
  Administered 2021-05-03 (×2): 40 ug via INTRAVENOUS

## 2021-05-03 MED ORDER — CHLORHEXIDINE GLUCONATE CLOTH 2 % EX PADS
6.0000 | MEDICATED_PAD | Freq: Every day | CUTANEOUS | Status: DC
  Administered 2021-05-03 – 2021-05-09 (×6): 6 via TOPICAL

## 2021-05-03 MED ORDER — SODIUM CHLORIDE 0.45 % IV SOLN
INTRAVENOUS | Status: DC | PRN
Start: 2021-05-03 — End: 2021-05-05

## 2021-05-03 MED ORDER — METOPROLOL TARTRATE 5 MG/5ML IV SOLN: 2.5000 mg | INTRAVENOUS | Status: DC | PRN

## 2021-05-03 MED ORDER — ASPIRIN 81 MG PO CHEW: 324.0000 mg | CHEWABLE_TABLET | Freq: Every day | ORAL | Status: DC

## 2021-05-03 MED ORDER — 0.9 % SODIUM CHLORIDE (POUR BTL) OPTIME
TOPICAL | Status: DC | PRN
  Administered 2021-05-03: 5000 mL
  Administered 2021-05-03: 1000 mL

## 2021-05-03 MED ORDER — BISACODYL 10 MG RE SUPP: 10.0000 mg | Freq: Every day | RECTAL | Status: DC

## 2021-05-03 MED ORDER — FAMOTIDINE 20 MG IN NS 100 ML IVPB
20.0000 mg | Freq: Two times a day (BID) | INTRAVENOUS | Status: AC
  Administered 2021-05-03 (×2): 20 mg via INTRAVENOUS
  Filled 2021-05-03 (×2): qty 100

## 2021-05-03 MED ORDER — PHENYLEPHRINE HCL-NACL 20-0.9 MG/250ML-% IV SOLN
0.0000 ug/min | INTRAVENOUS | Status: DC
  Filled 2021-05-03: qty 250

## 2021-05-03 MED ORDER — CHLORHEXIDINE GLUCONATE 0.12% ORAL RINSE (MEDLINE KIT)
15.0000 mL | Freq: Two times a day (BID) | OROMUCOSAL | Status: DC
Start: 2021-05-03 — End: 2021-05-05
  Administered 2021-05-03 – 2021-05-04 (×3): 15 mL via OROMUCOSAL

## 2021-05-03 MED ORDER — SODIUM CHLORIDE (PF) 0.9 % IJ SOLN
OROMUCOSAL | Status: DC | PRN
  Administered 2021-05-03 (×3): 4 mL via TOPICAL

## 2021-05-03 SURGICAL SUPPLY — 125 items
ADAPTER CARDIO PERF ANTE/RETRO (ADAPTER) ×5 IMPLANT
BAG DECANTER FOR FLEXI CONT (MISCELLANEOUS) ×5 IMPLANT
BLADE CLIPPER SURG (BLADE) ×5 IMPLANT
BLADE STERNUM SYSTEM 6 (BLADE) ×5 IMPLANT
BLADE SURG 11 STRL SS (BLADE) ×1 IMPLANT
BLADE SURG 15 STRL LF DISP TIS (BLADE) ×4 IMPLANT
BLADE SURG 15 STRL SS (BLADE) ×1
BNDG ELASTIC 4X5.8 VLCR STR LF (GAUZE/BANDAGES/DRESSINGS) ×5 IMPLANT
BNDG ELASTIC 6X5.8 VLCR STR LF (GAUZE/BANDAGES/DRESSINGS) ×5 IMPLANT
BNDG GAUZE ELAST 4 BULKY (GAUZE/BANDAGES/DRESSINGS) ×5 IMPLANT
CANISTER SUCT 3000ML PPV (MISCELLANEOUS) ×5 IMPLANT
CANN PRFSN 3/8XCNCT ST RT ANG (MISCELLANEOUS) ×4
CANNULA ARTERIAL NVNT 3/8 22FR (MISCELLANEOUS) IMPLANT
CANNULA EZ GLIDE AORTIC 21FR (CANNULA) ×5 IMPLANT
CANNULA GUNDRY RCSP 15FR (MISCELLANEOUS) IMPLANT
CANNULA PRFSN 3/8XCNCT RT ANG (MISCELLANEOUS) IMPLANT
CANNULA SUMP PERICARDIAL (CANNULA) ×5 IMPLANT
CANNULA VEN MTL TIP RT (MISCELLANEOUS) ×1
CANNULA VRC MALB SNGL STG 36FR (MISCELLANEOUS) IMPLANT
CATH CPB KIT HENDRICKSON (MISCELLANEOUS) ×5 IMPLANT
CATH ROBINSON RED A/P 18FR (CATHETERS) ×7 IMPLANT
CATH THORACIC 36FR (CATHETERS) ×5 IMPLANT
CATH THORACIC 36FR RT ANG (CATHETERS) ×5 IMPLANT
CLIP FOGARTY SPRING 6M (CLIP) IMPLANT
CONN 1/2X1/2X1/2  BEN (MISCELLANEOUS) ×1
CONN 1/2X1/2X1/2 BEN (MISCELLANEOUS) ×4 IMPLANT
CONN 3/8X1/2 ST GISH (MISCELLANEOUS) ×10 IMPLANT
CONN Y 3/8X3/8X3/8  BEN (MISCELLANEOUS)
CONN Y 3/8X3/8X3/8 BEN (MISCELLANEOUS) IMPLANT
CONTAINER PROTECT SURGISLUSH (MISCELLANEOUS) ×5 IMPLANT
DERMABOND ADVANCED (GAUZE/BANDAGES/DRESSINGS) ×1
DERMABOND ADVANCED .7 DNX12 (GAUZE/BANDAGES/DRESSINGS) IMPLANT
DRAIN CHANNEL 28F RND 3/8 FF (WOUND CARE) ×1 IMPLANT
DRAPE CARDIOVASCULAR INCISE (DRAPES) ×1
DRAPE SRG 135X102X78XABS (DRAPES) ×4 IMPLANT
DRAPE WARM FLUID 44X44 (DRAPES) ×5 IMPLANT
DRSG COVADERM 4X14 (GAUZE/BANDAGES/DRESSINGS) ×5 IMPLANT
ELECT CAUTERY BLADE 6.4 (BLADE) IMPLANT
ELECT REM PT RETURN 9FT ADLT (ELECTROSURGICAL) ×10
ELECTRODE REM PT RTRN 9FT ADLT (ELECTROSURGICAL) ×8 IMPLANT
FELT TEFLON 1X6 (MISCELLANEOUS) ×9 IMPLANT
GAUZE SPONGE 4X4 12PLY STRL (GAUZE/BANDAGES/DRESSINGS) ×10 IMPLANT
GLOVE SURG SIGNA 7.5 PF LTX (GLOVE) ×15 IMPLANT
GOWN STRL REUS W/ TWL LRG LVL3 (GOWN DISPOSABLE) ×16 IMPLANT
GOWN STRL REUS W/ TWL XL LVL3 (GOWN DISPOSABLE) ×8 IMPLANT
GOWN STRL REUS W/TWL LRG LVL3 (GOWN DISPOSABLE) ×4
GOWN STRL REUS W/TWL XL LVL3 (GOWN DISPOSABLE) ×2
HEMOSTAT POWDER SURGIFOAM 1G (HEMOSTASIS) ×15 IMPLANT
HEMOSTAT SURGICEL 2X14 (HEMOSTASIS) ×6 IMPLANT
INSERT FOGARTY XLG (MISCELLANEOUS) IMPLANT
IV CATH 18G X1.75 CATHLON (IV SOLUTION) ×1 IMPLANT
IV NS 1000ML (IV SOLUTION) ×1
IV NS 1000ML BAXH (IV SOLUTION) IMPLANT
KIT BASIN OR (CUSTOM PROCEDURE TRAY) ×5 IMPLANT
KIT DRAINAGE VACCUM ASSIST (KITS) ×1 IMPLANT
KIT SUCTION CATH 14FR (SUCTIONS) ×10 IMPLANT
KIT TURNOVER KIT B (KITS) ×5 IMPLANT
KIT VASOVIEW HEMOPRO 2 VH 4000 (KITS) ×5 IMPLANT
LINE VENT (MISCELLANEOUS) ×1 IMPLANT
MARKER GRAFT CORONARY BYPASS (MISCELLANEOUS) ×15 IMPLANT
NS IRRIG 1000ML POUR BTL (IV SOLUTION) ×26 IMPLANT
PACK E OPEN HEART (SUTURE) ×5 IMPLANT
PACK OPEN HEART (CUSTOM PROCEDURE TRAY) ×5 IMPLANT
PAD ARMBOARD 7.5X6 YLW CONV (MISCELLANEOUS) ×10 IMPLANT
PAD ELECT DEFIB RADIOL ZOLL (MISCELLANEOUS) ×5 IMPLANT
PENCIL BUTTON HOLSTER BLD 10FT (ELECTRODE) ×5 IMPLANT
POSITIONER HEAD DONUT 9IN (MISCELLANEOUS) ×5 IMPLANT
PUNCH AORTIC ROTATE 4.0MM (MISCELLANEOUS) IMPLANT
PUNCH AORTIC ROTATE 4.5MM 8IN (MISCELLANEOUS) ×1 IMPLANT
PUNCH AORTIC ROTATE 5MM 8IN (MISCELLANEOUS) IMPLANT
RING ANLPLS SIMUFORM 32 (Prosthesis & Implant Heart) IMPLANT
RING ANNULOPLASTY SIMUFORM 32 (Prosthesis & Implant Heart) ×1 IMPLANT
SET MPS 3-ND DEL (MISCELLANEOUS) ×1 IMPLANT
SUPPORT HEART JANKE-BARRON (MISCELLANEOUS) ×5 IMPLANT
SUT BONE WAX W31G (SUTURE) ×5 IMPLANT
SUT EB EXC GRN/WHT 2-0 D/A SH (SUTURE) ×10
SUT ETHIBOND 2 0 SH (SUTURE) ×6 IMPLANT
SUT ETHIBOND 2 0 SH 36X2 (SUTURE) ×8 IMPLANT
SUT ETHIBOND 2 0 V4 (SUTURE) IMPLANT
SUT ETHIBOND 2 0V4 GREEN (SUTURE) IMPLANT
SUT ETHIBOND 4 0 RB 1 (SUTURE) ×1 IMPLANT
SUT MNCRL AB 4-0 PS2 18 (SUTURE) ×1 IMPLANT
SUT PROLENE 3 0 SH DA (SUTURE) ×5 IMPLANT
SUT PROLENE 3 0 SH1 36 (SUTURE) ×5 IMPLANT
SUT PROLENE 4 0 RB 1 (SUTURE) ×6
SUT PROLENE 4 0 SH DA (SUTURE) ×3 IMPLANT
SUT PROLENE 4-0 RB1 .5 CRCL 36 (SUTURE) ×8 IMPLANT
SUT PROLENE 5 0 C 1 36 (SUTURE) ×10 IMPLANT
SUT PROLENE 5 0 CC1 (SUTURE) ×5 IMPLANT
SUT PROLENE 6 0 C 1 30 (SUTURE) ×10 IMPLANT
SUT PROLENE 7 0 BV 1 (SUTURE) ×4 IMPLANT
SUT PROLENE 7 0 BV1 MDA (SUTURE) ×5 IMPLANT
SUT PROLENE 8 0 BV175 6 (SUTURE) IMPLANT
SUT SILK  1 MH (SUTURE) ×3
SUT SILK 1 MH (SUTURE) ×8 IMPLANT
SUT SILK 1 TIES 10X30 (SUTURE) ×5 IMPLANT
SUT SILK 2 0 (SUTURE) ×1
SUT SILK 2 0 SH CR/8 (SUTURE) ×10 IMPLANT
SUT SILK 2-0 18XBRD TIE 12 (SUTURE) ×4 IMPLANT
SUT SILK 3 0 SH CR/8 (SUTURE) ×5 IMPLANT
SUT SILK 4 0 (SUTURE) ×1
SUT SILK 4-0 18XBRD TIE 12 (SUTURE) ×4 IMPLANT
SUT STEEL 6MS V (SUTURE) ×5 IMPLANT
SUT STEEL STERNAL CCS#1 18IN (SUTURE) IMPLANT
SUT STEEL SZ 6 DBL 3X14 BALL (SUTURE) ×5 IMPLANT
SUT TEM PAC WIRE 2 0 SH (SUTURE) ×20 IMPLANT
SUT VIC AB 1 CTX 36 (SUTURE) ×2
SUT VIC AB 1 CTX36XBRD ANBCTR (SUTURE) ×8 IMPLANT
SUT VIC AB 2-0 CT1 27 (SUTURE) ×1
SUT VIC AB 2-0 CT1 TAPERPNT 27 (SUTURE) IMPLANT
SUT VIC AB 2-0 CTX 27 (SUTURE) IMPLANT
SUT VIC AB 3-0 SH 27 (SUTURE)
SUT VIC AB 3-0 SH 27X BRD (SUTURE) IMPLANT
SUT VIC AB 3-0 X1 27 (SUTURE) IMPLANT
SUT VICRYL 4-0 PS2 18IN ABS (SUTURE) IMPLANT
SUTURE EB EXC GRN/WHT 2-0 D/A (SUTURE) IMPLANT
SYSTEM SAHARA CHEST DRAIN ATS (WOUND CARE) ×5 IMPLANT
TAPE CLOTH SURG 4X10 WHT LF (GAUZE/BANDAGES/DRESSINGS) ×1 IMPLANT
TAPE PAPER 2X10 WHT MICROPORE (GAUZE/BANDAGES/DRESSINGS) ×1 IMPLANT
TOWEL GREEN STERILE (TOWEL DISPOSABLE) ×5 IMPLANT
TOWEL GREEN STERILE FF (TOWEL DISPOSABLE) ×5 IMPLANT
TUBING LAP HI FLOW INSUFFLATIO (TUBING) ×5 IMPLANT
UNDERPAD 30X36 HEAVY ABSORB (UNDERPADS AND DIAPERS) ×5 IMPLANT
VRC MALLEABLE SINGLE STG 36FR (MISCELLANEOUS) ×5
WATER STERILE IRR 1000ML POUR (IV SOLUTION) ×10 IMPLANT

## 2021-05-03 NOTE — Brief Op Note (Signed)
04/26/2021 - 05/03/2021  10:10 AM  PATIENT:  Joseph Robles  70 y.o. male  PRE-OPERATIVE DIAGNOSIS:  1. Coronary Artery Disease 2. Severe Mitral Regurgitation  POST-OPERATIVE DIAGNOSIS:  1. Coronary Artery Disease 2. Severe mitral Regurgitation  PROCEDURE:  TRANSESOPHAGEAL ECHOCARDIOGRAM (TEE), CORONARY ARTERY BYPASS GRAFTING (CABG) x 2 (SVG to OM, SVG to PDA) with ENDOVEIN HARVEST OF RIGHT GREATER SAPHENOUS VEIN, MITRAL VALVE REPAIR OR REPAIR (MVR) (using a Medtronic MV annuloplasty ring Model # [id number], Serial # [id number], Size 32 mm), and APPLICATION OF CELL SAVER RIGHT EVH HARVEST TIME: 17 minutes;RIGHT EVH PREP TIME: 9 minutes  SURGEON:  Surgeon(s) and Role:    Loreli Slot, MD - Primary  PHYSICIAN ASSISTANT: Doree Fudge PA-C  ASSISTANTS: Tanda Rockers RNFA   ANESTHESIA:   general  EBL:  Per anesthesia,perfusion record  DRAINS:  Chest tubes placed in the mediastinal and pleural spaces    COUNTS CORRECT:  YES  DICTATION: .Dragon Dictation  PLAN OF CARE: Admit to inpatient   PATIENT DISPOSITION:  ICU - intubated and hemodynamically stable.   Delay start of Pharmacological VTE agent (>24hrs) due to surgical blood loss or risk of bleeding: yes  BASELINE WEIGHT: 68 kg

## 2021-05-03 NOTE — Progress Notes (Addendum)
[address]       [place] [address]             [phone]      Day of Surgery  Procedure(s) (LRB): CORONARY ARTERY BYPASS GRAFTING (CABG) TIMES TWO ON PUMP USING ENDOSCOPICALLY HARVESTED RIGHT GREATER SAPHENOUS VEIN (N/A) MITRAL VALVE REPAIR USING MEDTRONIC SIMUFORM ANNULOPLASTY RING (MVR) (N/A) TRANSESOPHAGEAL ECHOCARDIOGRAM (TEE) (N/A) ENDOVEIN HARVEST OF GREATER SAPHENOUS VEIN (Right) APPLICATION OF CELL SAVER   Total Length of Stay:  LOS: 7 days    SUBJECTIVE:  Vitals:   05/03/21 0759 05/03/21 1444  BP:    Pulse: (!) 56   Resp: 14   Temp:    SpO2: 100% 100%    Intake/Output      09/27 0701 09/28 0700 09/28 0701 09/29 0700   P.O. 240    I.V. (mL/kg)  2500 (36.8)   Blood  475   IV Piggyback  850   Total Intake(mL/kg) 240 (3.5) 3825 (56.3)   Urine (mL/kg/hr) 2425 (1.5) 1500 (2.7)   Emesis/NG output 0    Blood  750   Total Output 2425 2250   Net -2185 +1575        Emesis Occurrence 900 x        sodium chloride     [START ON 05/04/2021] sodium chloride     sodium chloride     albumin human     And   sodium chloride      ceFAZolin (ANCEF) IV     dexmedetomidine (PRECEDEX) IV infusion     insulin     lactated ringers     lactated ringers     lactated ringers     magnesium sulfate     nitroGLYCERIN     phenylephrine (NEO-SYNEPHRINE) Adult infusion     potassium chloride     vancomycin      CBC    Component Value Date/Time   WBC 6.3 05/03/2021 0239   RBC 4.21 (L) 05/03/2021 0239   HGB 8.2 (L) 05/03/2021 1306   HCT 24.0 (L) 05/03/2021 1306   PLT 126 (L) 05/03/2021 1207   MCV 86.9 05/03/2021 0239   MCH 29.7 05/03/2021 0239   MCHC 34.2 05/03/2021 0239   RDW 13.6 05/03/2021 0239   CMP     Component Value Date/Time   NA 136 05/03/2021 1306   K 4.5 05/03/2021 1306   CL 103 05/03/2021 1306   CO2 26 05/03/2021 0239   GLUCOSE 145 (H) 05/03/2021 1306   BUN 15 05/03/2021 1306   CREATININE 0.60 (L) 05/03/2021  1306   CALCIUM 9.0 05/03/2021 0239   PROT 6.3 (L) 05/03/2021 0015   ALBUMIN 3.4 (L) 05/03/2021 0015   AST 16 05/03/2021 0015   ALT 20 05/03/2021 0015   ALKPHOS 84 05/03/2021 0015   BILITOT 0.7 05/03/2021 0015   GFRNONAA >60 05/03/2021 0239   ABG    Component Value Date/Time   PHART 7.420 05/03/2021 1303   PCO2ART 40.0 05/03/2021 1303   PO2ART 101 05/03/2021 1303   HCO3 26.0 05/03/2021 1303   TCO2 23 05/03/2021 1306   ACIDBASEDEF 1.0 04/26/2021 1353   O2SAT 98.0 05/03/2021 1303   CBG (last 3)  Recent Labs    05/02/21 2136 05/03/21 0641 05/03/21 0801  GLUCAP 175* 158* 144*   ASSESSMENT:  Patient just returned from OR... CT's immediately dumped 450 cc, clot present in pleural tube.. I suctioned with ET catheter....labs sent to check coags  NSR, not currently on any drips,   Chest tube output decreasing had 80 cc out over 30 min.  Labs showed H/H of 8.4/24.7.   INR is elevated at 1.5, PTT is elevated at 44... Discussed with Dr. Dorris Fetch and patient treated with additional 25 mg of Protamine.  Fibrinogen level is low at 185  Monitor closely  Lowella Dandy, PA-C 05/03/21     roun

## 2021-05-03 NOTE — Anesthesia Procedure Notes (Signed)
Arterial Line Insertion Start/End9/28/2022 2:13 PM, 05/03/2021 2:16 PM Performed by: Beryle Lathe, MD, anesthesiologist  Patient location: OR. Preanesthetic checklist: patient identified, IV checked, risks and benefits discussed, surgical consent, monitors and equipment checked, pre-op evaluation, timeout performed and anesthesia consent Patient sedated Right, radial was placed Catheter size: 20 G Hand hygiene performed   Attempts: 1 Procedure performed using ultrasound guided technique. Ultrasound Notes:anatomy identified, needle tip was noted to be adjacent to the nerve/plexus identified, no ultrasound evidence of intravascular and/or intraneural injection and image(s) printed for medical record Following insertion, dressing applied and Biopatch. Post procedure assessment: unchanged and normal  Patient tolerated the procedure well with no immediate complications.

## 2021-05-03 NOTE — Anesthesia Postprocedure Evaluation (Signed)
Anesthesia Post Note  Patient: Vincient Vanaman  Procedure(s) Performed: CORONARY ARTERY BYPASS GRAFTING (CABG) TIMES TWO ON PUMP USING ENDOSCOPICALLY HARVESTED RIGHT GREATER SAPHENOUS VEIN (Chest) MITRAL VALVE REPAIR USING MEDTRONIC SIMUFORM ANNULOPLASTY RING (MVR) TRANSESOPHAGEAL ECHOCARDIOGRAM (TEE) ENDOVEIN HARVEST OF GREATER SAPHENOUS VEIN (Right: Leg Upper) APPLICATION OF CELL SAVER     Patient location during evaluation: ICU Anesthesia Type: General Level of consciousness: sedated and patient remains intubated per anesthesia plan Pain management: pain level controlled Vital Signs Assessment: post-procedure vital signs reviewed and stable Respiratory status: patient remains intubated per anesthesia plan Cardiovascular status: stable Postop Assessment: no apparent nausea or vomiting Anesthetic complications: no   No notable events documented.  Last Vitals:  Vitals:   05/03/21 1645 05/03/21 1700  BP:  108/71  Pulse: 80 77  Resp: 12 14  Temp: (!) 35.8 C (!) 36.1 C  SpO2: 100% 100%    Last Pain:  Vitals:   05/03/21 0641  TempSrc: Oral  PainSc:                  Beryle Lathe

## 2021-05-03 NOTE — Interval H&P Note (Signed)
History and Physical Interval Note:  For CABG, mitral repair. If replacement necessary he wishes to have a tissue valve with understanding of limitations  05/03/2021 7:13 AM  Joseph Robles  has presented today for surgery, with the diagnosis of CAD MR.  The various methods of treatment have been discussed with the patient and family. After consideration of risks, benefits and other options for treatment, the patient has consented to  Procedure(s): CORONARY ARTERY BYPASS GRAFTING (CABG) (N/A) MITRAL VALVE REPAIR OR REPLACEMENT (MVR) (N/A) TRANSESOPHAGEAL ECHOCARDIOGRAM (TEE) (N/A) as a surgical intervention.  The patient's history has been reviewed, patient examined, no change in status, stable for surgery.  I have reviewed the patient's chart and labs.  Questions were answered to the patient's satisfaction.     Loreli Slot

## 2021-05-03 NOTE — Hospital Course (Addendum)
HPI: [name] is a 70 year old male with past history significant for Dyslipidemia, type 2 diabetes mellitus, and tobacco use, and urethral stricture managed with a suprapubic catheter who presented to the [facility] 5 days ago following a syncopal episode.  He was in his usual state of health earlier that day when he began to feel strange while sitting in his living room.  He attempted to go outside t".  "He had some fresh air" but had a syncopal episode falling backwards and lacerating his head.  His wife witnessed the event and said he was unconscious for about 20 seconds.  He brought to [facility] via EMS.  His scalp laceration was repaired.  Further work-up for syncope included an EKG that showed sinus rhythm but no ST elevations or depressions.  Serum chemistries were unremarkable.  His BNP was 677, troponin was negative x3.  An echocardiogram obtained in the ED demonstrated an ejection fraction of 55 to 60%.  There was mild RV enlargement and moderate left atrial enlargement.  There was severe mitral insufficiency noted.  Joseph Robles also reports he had a chest x-ray and CT scan of his head while at [facility] but I am not able to find reports of these studies.      Joseph Robles was seen by Dr. Tomie China in consultation and transfer to [facility] for left heart catheterization was recommended.    Joseph Robles [facility]26/[facility]21.  Left heart catheterization on that date demonstrated two-vessel coronary artery disease with a 70% proximal RCA stenosis followed by mid total occlusion of the RCA.  There was left-to-right collaterals in place.  In addition, there was an 80% proximal circumflex stenosis before the first OM.  The LAD had a long 30% stenosis in its midportion.  Joseph Robles remained stable. Echo showed LVEF 55% and severe MR.  Dr. Cliffton Asters initially evaluated the patient as he was on call. Dr. Dorris Fetch had OR availability so the then  evaluted the patient. Dr. Dorris Fetch discussed the need for coronary artery bypass grafting surgery and mitral valve repair vs replacement. Potential risks, benefits, and complications of the surgery were discussed with the patient and he agreed to proceed with surgery.  Hospital Course: Patient underwent a CABG x 2 and mitral valve repair using a 32 mm Medtronic Simuform ring. He was transferred from the OR to [facility] ICU in stable condition.  He initially developed high output from his CT with 450 being immediately put out upon arrival to ICU.  CXR did not show evidence of significant pleural effusion.  Labs obtained showed H/H of 8.4/24.7, elevated INR at 1.5, and elevated PTT at 44.  He was given an additional 25 mg of Protamine and his CT output had decreased.  He was successfully extubated overnight.  He developed worsening blood loss anemia with hemoglobin level dropping to 5.1 overnight and he was transfused 2 units of packed cells overnight.  Repeat Hg level was 7.4  The patient's AM CXR continued to be free of pleural effusion.  He was in NSR with RBBB and was hemodynamically stable.  He underwent removal of SWAN Ganz and arterial catheter.  He was not initiated on Lovenox therapy for DVT prophylaxis due to his post operative bleeding.  His chest tube output decreased and these were able to be removed on 05/05/2021.  On 05/05/2021 he was also given an additional unit of blood for hemoglobin of 7.0.  He was placed on iron  for anemia but on 05/08/2021 hemoglobin was 7.1 and he was subsequently transfused 1 additional unit of packed cells.  His thrombocytopenia is showing an improving trend.  He will also receive some diuretics following the transfusion.  He continues to be weak and iron has also been starte to assist with management of anemia.  Renal function is remaining within normal limits.  He did have an episode of low blood sugar and Levemir has been discontinued.  He has been resumed on his home dosing of  metformin and is also placed on Tradjenta which is the pharmacy substitute for Januvia.  Januvia will be resumed at time of discharge.  Incisions are noted to be healing well.  Oxygen has been weaned and he maintains good saturations on room air.  He is making slow but steady progress in his physical recovery with cardiac rehab and we will arrange a rolling walker for him to have at home.  On 05/09/2021 his hemoglobin was***.  He continues to maintain sinus rhythm.Epicardial pacer wires were removed in a routine manner.

## 2021-05-03 NOTE — Anesthesia Procedure Notes (Addendum)
Procedure Name: Intubation Date/Time: 05/03/2021 8:31 AM Performed by: Justin Mend, RN Pre-anesthesia Checklist: Patient identified, Emergency Drugs available, Suction available and Patient being monitored Patient Re-evaluated:Patient Re-evaluated prior to induction Oxygen Delivery Method: Circle system utilized Preoxygenation: Pre-oxygenation with 100% oxygen Induction Type: IV induction Ventilation: Mask ventilation without difficulty and Oral airway inserted - appropriate to patient size Laryngoscope Size: Mac and 4 Grade View: Grade II Tube type: Oral Tube size: 8.0 mm Number of attempts: 1 Airway Equipment and Method: Stylet and Oral airway Placement Confirmation: ETT inserted through vocal cords under direct vision, positive ETCO2 and breath sounds checked- equal and bilateral Secured at: 23 cm Tube secured with: Tape Dental Injury: Teeth and Oropharynx as per pre-operative assessment  Comments: Intubation by [facility]

## 2021-05-03 NOTE — Discharge Instructions (Signed)
Discharge Instructions:  1. You may shower, please wash incisions daily with soap and water and keep dry.  If you wish to cover wounds with dressing you may do so but please keep clean and change daily.  No tub baths or swimming until incisions have completely healed.  If your incisions become red or develop any drainage please call our office at [phone]  2. No Driving until cleared by Dr.Henrickson's office and you are no longer using narcotic pain medications  3. Monitor your weight daily.. Please use the same scale and weigh at same time... If you gain 5-10 lbs in 48 hours with associated lower extremity swelling, please contact our office at [phone]  4. Fever of 101.5 for at least 24 hours with no source, please contact our office at [phone]  5. Activity- up as tolerated, please walk at least 3 times per day.  Avoid strenuous activity, no lifting, pushing, or pulling with your arms over 8-10 lbs for a minimum of 6 weeks  6. If any questions or concerns arise, please do not hesitate to contact our office at [phone]

## 2021-05-03 NOTE — Procedures (Deleted)
Extubation Procedure Note  Patient Details:   Name: Joseph Robles DOB: 1951-05-11 MRN: [id number]   Airway Documentation:  Airway 7 mm (Active)  Secured at (cm) 25 cm 05/03/21 1912  Measured From Lips 05/03/21 1912  Secured Location Right 05/03/21 1912  Secured By [employer] Tape 05/03/21 1912  Prone position No 05/03/21 1444  Cuff Pressure (cm H2O) Green OR 18-26 CmH2O 05/03/21 1444  Site Condition Dry 05/03/21 1912   Vent end date: (not recorded) Vent end time: (not recorded)   Evaluation  O2 sats: stable throughout Complications: No apparent complications Patient did tolerate procedure well. Bilateral Breath Sounds: Clear, Diminished   Yes Pt extubated to 4L salter. O2 sat 100% and stable throughout extubation. Extubated per rapid wean/extubation protocol. Breath sounds clear and diminished. NIF -25 VC 1.6 Liters.  Joseph Robles 05/03/2021, 9:43 PM

## 2021-05-03 NOTE — Anesthesia Procedure Notes (Signed)
Arterial Line Insertion Start/End9/28/2022 7:24 AM, 05/03/2021 7:43 AM Performed by: Beryle Lathe, MD, anesthesiologist  Patient location: Pre-op. Preanesthetic checklist: patient identified, IV checked, risks and benefits discussed, surgical consent, monitors and equipment checked, pre-op evaluation, timeout performed and anesthesia consent Lidocaine 1% used for infiltration and patient sedated Left, radial was placed Catheter size: 20 G Hand hygiene performed   Attempts: 3 (Previous attempts by CRNA unsuccessful on same side more distal to Dr. Alejandro Mulling successful attempt) Procedure performed using ultrasound guided technique. Ultrasound Notes:anatomy identified, needle tip was noted to be adjacent to the nerve/plexus identified, no ultrasound evidence of intravascular and/or intraneural injection and image(s) printed for medical record Following insertion, dressing applied and Biopatch. Post procedure assessment: unchanged and normal  Patient tolerated the procedure well with no immediate complications.

## 2021-05-03 NOTE — Procedures (Signed)
Extubation Procedure Note  Patient Details:   Name: Yoniel Arkwright DOB: [birth year] MRN: [id number]   Airway Documentation:  Airway 7 mm (Active)  Secured at (cm) 25 cm 05/03/21 1912  Measured From Lips 05/03/21 1912  Secured Location Right 05/03/21 1912  Secured By Pink Tape 05/03/21 1912  Prone position No 05/03/21 1444  Cuff Pressure (cm H2O) Green OR 18-26 CmH2O 05/03/21 1444  Site Condition Dry 05/03/21 1912   Vent end date: 05/03/21  Vent end time: 09:43 PM  Evaluation  O2 sats: stable throughout Complications: No apparent complications Patient did tolerate procedure well. Bilateral Breath Sounds: Clear, Diminished   Yes Pt extubated to 4L salter. O2 sat 100% and stable throughout extubation. Extubated per rapid wean/extubation protocol. Breath sounds clear and diminished. NIF -25 VC 1.6 Liters.  [place] G Kaseem Vastine 05/03/2021, 9:43 PM

## 2021-05-03 NOTE — Transfer of Care (Signed)
Immediate Anesthesia Transfer of Care Note  Patient: Joseph Robles  Procedure(s) Performed: CORONARY ARTERY BYPASS GRAFTING (CABG) TIMES TWO ON PUMP USING ENDOSCOPICALLY HARVESTED RIGHT GREATER SAPHENOUS VEIN (Chest) MITRAL VALVE REPAIR USING MEDTRONIC SIMUFORM ANNULOPLASTY RING (MVR) TRANSESOPHAGEAL ECHOCARDIOGRAM (TEE) ENDOVEIN HARVEST OF GREATER SAPHENOUS VEIN (Right: Leg Upper) APPLICATION OF CELL SAVER  Patient Location: ICU  Anesthesia Type:General  Level of Consciousness: Patient remains intubated per anesthesia plan  Airway & Oxygen Therapy: Patient placed on Ventilator (see vital sign flow sheet for setting)  Post-op Assessment: Report given to RN and Post -op Vital signs reviewed and stable  Post vital signs: Reviewed and stable  Last Vitals:  Vitals Value Taken Time  BP 114/83 05/03/21 1445  Temp 34.9 C 05/03/21 1455  Pulse 90 05/03/21 1455  Resp 16 05/03/21 1455  SpO2 100 % 05/03/21 1455  Vitals shown include unvalidated device data.  Last Pain:  Vitals:   05/03/21 0641  TempSrc: Oral  PainSc:       Patients Stated Pain Goal: 0 (05/01/21 0815)  Complications: No notable events documented.

## 2021-05-03 NOTE — Anesthesia Procedure Notes (Signed)
Central Venous Catheter Insertion Performed by: Audry Pili, MD, anesthesiologist Start/End9/28/2022 7:24 AM, 05/03/2021 7:33 AM Patient location: Pre-op. Preanesthetic checklist: patient identified, IV checked, risks and benefits discussed, surgical consent, monitors and equipment checked, pre-op evaluation, timeout performed and anesthesia consent Position: Trendelenburg Lidocaine 1% used for infiltration and patient sedated Hand hygiene performed , maximum sterile barriers used  and Seldinger technique used Catheter size: 8.5 Fr Central line was placed.MAC introducer Procedure performed using ultrasound guided technique. Ultrasound Notes:anatomy identified, needle tip was noted to be adjacent to the nerve/plexus identified, no ultrasound evidence of intravascular and/or intraneural injection and image(s) printed for medical record Attempts: 1 Following insertion, line sutured, dressing applied and Biopatch. Post procedure assessment: blood return through all ports, no air and free fluid flow  Patient tolerated the procedure well with no immediate complications.

## 2021-05-03 NOTE — Discharge Summary (Signed)
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      Discharge Medications: Allergies as of 05/09/2021       Reactions   Neosporin [neomycin-bacitracin Zn-polymyx] Rash        Medication List     STOP taking these medications    nystatin cream Commonly known as: MYCOSTATIN       TAKE these medications    ascorbic acid 100 MG tablet Commonly known as: VITAMIN C Take 100 mg by mouth daily.   aspirin 325 MG EC tablet Take 1 tablet (325 mg total) by mouth daily. Start taking on: May 10, 2021   atorvastatin 10 MG tablet Commonly known as: LIPITOR Take 10 mg by mouth daily.   Cholecalciferol 25 MCG (1000 UT) tablet Take 1,000 Units by mouth daily.   cyanocobalamin 100 MCG tablet Take 100 mcg by mouth daily.   ferrous gluconate 324 MG tablet Commonly known as: FERGON Take 1 tablet (324 mg total) by mouth 2 (two) times daily with a meal.   metFORMIN 1000 MG tablet Commonly known as: GLUCOPHAGE Take 1,000 mg by mouth 2 (two) times daily.   metoprolol succinate 25 MG 24 hr tablet Commonly known as: TOPROL-XL Take 1 tablet (25 mg total) by mouth daily. Start taking on: May 10, 2021   oxybutynin 5 MG tablet Commonly known as: DITROPAN Take 5 mg by mouth daily as needed for bladder spasms.   oxyCODONE 5 MG immediate release tablet Commonly known as: Oxy IR/ROXICODONE Take 1 tablet (5 mg total) by mouth every 4 (four) hours as needed for up to 5 days for severe pain.   sitaGLIPtin 100 MG tablet Commonly known as: JANUVIA Take 100 mg by mouth daily.               Durable Medical Equipment  (From admission, onward)           Start     Ordered   05/09/21 1337  For home use only DME 3 n 1  Once        05/09/21 1336   05/07/21 0809  For home use only DME Walker rolling  Once       Question Answer Comment  Walker: With 5 Inch Wheels   Joseph needs a walker to treat with the following condition Physical deconditioning      05/07/21 0809           [name] has been discharged on:   1.Beta Blocker:  Yes [  y ]                              No   [   ]                              If No, reason:  2.Ace Inhibitor/ARB: Yes [   ]  No  [  n  ]                                     If No, reason: Soft BP  3.Statin:   Yes [ y  ]                  No  [   ]                  If No, reason:  4.Ecasa:  Yes  [ y  ]                  No   [   ]                  If No, reason:  Joseph had ACS upon admission:  Plavix/P2Y12 inhibitor: Yes [   ]                                      No  [ n  ]    Follow Up Appointments:  Follow-up Information     Crist Fat, MD. Go on 05/15/2021.   Specialty: Urology Why: Monday 05/15/2021 at 11 am.  suprapubic catheter change Contact information: [address] [place] [address] [phone]         Loreli Slot, MD Follow up.   Specialty: Cardiothoracic Surgery Contact information: [address] [place] [address] [phone]         [facility] Follow up.   Why: rolling walker arranged- to be delivered to  room prior to discharge Contact information: [address] [facility] [place] [address] [phone]         [facility] Follow up.   Specialty: Home Health Services Why: Iantha Fallen)- pre-op referral protocol for [facility] needs per [facility] office- they will contact you post discharge to schedule visits. Contact information: [address] [place] [address] [phone]         Revankar, Aundra Dubin, MD. Go on 05/17/2021.   Specialty: Cardiology Why: Your appointment is at 2:00pm Contact information: [address] [place] [address] [phone]         [facility] [employer] Follow up on 06/15/2021.   Specialty: Cardiology Why: Your follow up ECHOCARDIOGRAM is at 10:15am Contact information: [address] [place] [address] [phone]                Signed: Dale Downie[place]-Lawson[place]Dumont 05/09/2021, 1:57 PM

## 2021-05-04 ENCOUNTER — Encounter (HOSPITAL_COMMUNITY): Payer: Self-pay | Admitting: Thoracic Surgery (Cardiothoracic Vascular Surgery)

## 2021-05-04 ENCOUNTER — Inpatient Hospital Stay (HOSPITAL_COMMUNITY): Payer: Medicare Other

## 2021-05-04 LAB — BASIC METABOLIC PANEL
Anion gap: 5 (ref 5–15)
Anion gap: 7 (ref 5–15)
BUN: 12 mg/dL (ref 8–23)
BUN: 20 mg/dL (ref 8–23)
CO2: 21 mmol/L — ABNORMAL LOW (ref 22–32)
CO2: 22 mmol/L (ref 22–32)
Calcium: 7.7 mg/dL — ABNORMAL LOW (ref 8.9–10.3)
Calcium: 8.2 mg/dL — ABNORMAL LOW (ref 8.9–10.3)
Chloride: 107 mmol/L (ref 98–111)
Chloride: 102 mmol/L (ref 98–111)
Creatinine, Ser: 0.72 mg/dL (ref 0.61–1.24)
Creatinine, Ser: 1.07 mg/dL (ref 0.61–1.24)
GFR, Estimated: >60 mL/min (ref >60)
GFR, Estimated: >60 mL/min (ref >60)
Glucose, Bld: 126 mg/dL — ABNORMAL HIGH (ref 70–99)
Glucose, Bld: 257 mg/dL — ABNORMAL HIGH (ref 70–99)
Potassium: 4.6 mmol/L (ref 3.5–5.1)
Potassium: 4.6 mmol/L (ref 3.5–5.1)
Sodium: 133 mmol/L — ABNORMAL LOW (ref 135–145)
Sodium: 131 mmol/L — ABNORMAL LOW (ref 135–145)

## 2021-05-04 LAB — CBC
HCT: 21.9 % — ABNORMAL LOW (ref 39.0–52.0)
HCT: 21.4 % — ABNORMAL LOW (ref 39.0–52.0)
Hemoglobin: 7.4 g/dL — ABNORMAL LOW (ref 13.0–17.0)
Hemoglobin: 7.2 g/dL — ABNORMAL LOW (ref 13.0–17.0)
MCH: 29.6 pg (ref 26.0–34.0)
MCH: 30 pg (ref 26.0–34.0)
MCHC: 33.8 g/dL (ref 30.0–36.0)
MCHC: 33.6 g/dL (ref 30.0–36.0)
MCV: 87.6 fL (ref 80.0–100.0)
MCV: 89.2 fL (ref 80.0–100.0)
Platelets: 94 10*3/uL — ABNORMAL LOW (ref 150–400)
Platelets: 93 10*3/uL — ABNORMAL LOW (ref 150–400)
RBC: 2.5 MIL/uL — ABNORMAL LOW (ref 4.22–5.81)
RBC: 2.4 MIL/uL — ABNORMAL LOW (ref 4.22–5.81)
RDW: 14.3 % (ref 11.5–15.5)
RDW: 15 % (ref 11.5–15.5)
WBC: 12.9 10*3/uL — ABNORMAL HIGH (ref 4.0–10.5)
WBC: 14.4 10*3/uL — ABNORMAL HIGH (ref 4.0–10.5)
nRBC: 0 % (ref 0.0–0.2)
nRBC: 0 % (ref 0.0–0.2)

## 2021-05-04 LAB — GLUCOSE, CAPILLARY
Glucose-Capillary: 102 mg/dL — ABNORMAL HIGH (ref 70–99)
Glucose-Capillary: 107 mg/dL — ABNORMAL HIGH (ref 70–99)
Glucose-Capillary: 115 mg/dL — ABNORMAL HIGH (ref 70–99)
Glucose-Capillary: 116 mg/dL — ABNORMAL HIGH (ref 70–99)
Glucose-Capillary: 121 mg/dL — ABNORMAL HIGH (ref 70–99)
Glucose-Capillary: 130 mg/dL — ABNORMAL HIGH (ref 70–99)
Glucose-Capillary: 134 mg/dL — ABNORMAL HIGH (ref 70–99)
Glucose-Capillary: 140 mg/dL — ABNORMAL HIGH (ref 70–99)
Glucose-Capillary: 152 mg/dL — ABNORMAL HIGH (ref 70–99)
Glucose-Capillary: 207 mg/dL — ABNORMAL HIGH (ref 70–99)
Glucose-Capillary: 212 mg/dL — ABNORMAL HIGH (ref 70–99)
Glucose-Capillary: 251 mg/dL — ABNORMAL HIGH (ref 70–99)
Glucose-Capillary: 80 mg/dL (ref 70–99)
Glucose-Capillary: 91 mg/dL (ref 70–99)

## 2021-05-04 LAB — MAGNESIUM
Magnesium: 2.1 mg/dL (ref 1.7–2.4)
Magnesium: 2.2 mg/dL (ref 1.7–2.4)

## 2021-05-04 LAB — PREPARE RBC (CROSSMATCH)

## 2021-05-04 MED ORDER — INSULIN DETEMIR 100 UNIT/ML ~~LOC~~ SOLN
10.0000 [IU] | Freq: Two times a day (BID) | SUBCUTANEOUS | Status: DC
  Administered 2021-05-04 (×2): 10 [IU] via SUBCUTANEOUS
  Filled 2021-05-04 (×4): qty 0.1

## 2021-05-04 MED ORDER — ZOLPIDEM TARTRATE 5 MG PO TABS
5.0000 mg | ORAL_TABLET | Freq: Every evening | ORAL | Status: DC | PRN
  Administered 2021-05-04 – 2021-05-07 (×4): 5 mg via ORAL
  Filled 2021-05-04 (×4): qty 1

## 2021-05-04 MED ORDER — SODIUM CHLORIDE 0.9% IV SOLUTION: Freq: Once | INTRAVENOUS | Status: DC

## 2021-05-04 MED ORDER — INSULIN ASPART 100 UNIT/ML IJ SOLN
0.0000 [IU] | INTRAMUSCULAR | Status: DC
  Administered 2021-05-04 (×2): 8 [IU] via SUBCUTANEOUS
  Administered 2021-05-04: 12 [IU] via SUBCUTANEOUS

## 2021-05-04 MED FILL — Mannitol IV Soln 20%: INTRAVENOUS | Qty: 500 | Status: AC

## 2021-05-04 MED FILL — Lidocaine HCl Local Preservative Free (PF) Inj 1%: INTRAMUSCULAR | Qty: 5 | Status: AC

## 2021-05-04 MED FILL — Albumin, Human Inj 5%: INTRAVENOUS | Qty: 250 | Status: AC

## 2021-05-04 MED FILL — Sodium Bicarbonate IV Soln 8.4%: INTRAVENOUS | Qty: 50 | Status: AC

## 2021-05-04 MED FILL — Sodium Chloride IV Soln 0.9%: INTRAVENOUS | Qty: 2000 | Status: AC

## 2021-05-04 MED FILL — Electrolyte-R (PH 7.4) Solution: INTRAVENOUS | Qty: 4000 | Status: AC

## 2021-05-04 NOTE — Progress Notes (Signed)
1 Day Post-Op Procedure(s) (LRB): CORONARY ARTERY BYPASS GRAFTING (CABG) TIMES TWO ON PUMP USING ENDOSCOPICALLY HARVESTED RIGHT GREATER SAPHENOUS VEIN (N/A) MITRAL VALVE REPAIR USING MEDTRONIC SIMUFORM ANNULOPLASTY RING (MVR) (N/A) TRANSESOPHAGEAL ECHOCARDIOGRAM (TEE) (N/A) ENDOVEIN HARVEST OF GREATER SAPHENOUS VEIN (Right) APPLICATION OF CELL SAVER Subjective: C/o feeling weak Nausea earlier but none now  Objective: Vital signs in last 24 hours: Temp:  [94.5 F (34.7 C)-99.3 F (37.4 C)] 99 F (37.2 C) (09/29 0700) Pulse Rate:  [72-92] 89 (09/29 0700) Cardiac Rhythm: Atrial paced (09/29 0400) Resp:  [10-25] 25 (09/29 0700) BP: (89-130)/(61-83) 104/66 (09/29 0700) SpO2:  [91 %-100 %] 99 % (09/29 0700) Arterial Line BP: (88-271)/(34-267) 138/48 (09/29 0700) FiO2 (%):  [40 %-50 %] 40 % (09/28 2100) Weight:  [74.5 kg] 74.5 kg (09/29 0500)  Hemodynamic parameters for last 24 hours: PAP: (14-29)/(9-19) 16/10 CO:  [3.1 L/min-5.3 L/min] 4.6 L/min CI:  [1.6 L/min/m2-2.8 L/min/m2] 2.4 L/min/m2  Intake/Output from previous day: 09/28 0701 - 09/29 0700 In: 6495.4 [P.O.:50; I.V.:3685.2; Blood:475; IV Piggyback:2285.2] Out: 5795 [Urine:4155; Blood:750; Chest Tube:890] Intake/Output this shift: No intake/output data recorded.  General appearance: alert, cooperative, and no distress Neurologic: intact Heart: regular rate and rhythm Lungs: clear to auscultation bilaterally Abdomen: normal findings: soft, non-tender  Lab Results: Recent Labs    05/03/21 2030 05/03/21 2131 05/03/21 2329 05/04/21 0317  WBC 15.9*  --   --  12.9*  HGB 6.6*   < > 6.8* 7.4*  HCT 19.6*   < > 20.2* 21.9*  PLT 121*  --   --  94*   < > = values in this interval not displayed.   BMET:  Recent Labs    05/03/21 2030 05/03/21 2131 05/03/21 2322 05/04/21 0317  NA 134*   < > 137 133*  K 4.1   < > 4.4 4.6  CL 108  --   --  107  CO2 22  --   --  21*  GLUCOSE 116*  --   --  126*  BUN 13  --   --   12  CREATININE 0.64  --   --  0.72  CALCIUM 7.3*  --   --  7.7*   < > = values in this interval not displayed.    PT/INR:  Recent Labs    05/03/21 1428  LABPROT 18.5*  INR 1.5*   ABG    Component Value Date/Time   PHART 7.380 05/03/2021 2322   HCO3 23.3 05/03/2021 2322   TCO2 24 05/03/2021 2322   ACIDBASEDEF 1.0 05/03/2021 2322   O2SAT 99.0 05/03/2021 2322   CBG (last 3)  Recent Labs    05/04/21 0534 05/04/21 0608 05/04/21 0659  GLUCAP 80 116* 140*    Assessment/Plan: S/P Procedure(s) (LRB): CORONARY ARTERY BYPASS GRAFTING (CABG) TIMES TWO ON PUMP USING ENDOSCOPICALLY HARVESTED RIGHT GREATER SAPHENOUS VEIN (N/A) MITRAL VALVE REPAIR USING MEDTRONIC SIMUFORM ANNULOPLASTY RING (MVR) (N/A) TRANSESOPHAGEAL ECHOCARDIOGRAM (TEE) (N/A) ENDOVEIN HARVEST OF GREATER SAPHENOUS VEIN (Right) APPLICATION OF CELL SAVER POD # 1 Overall looks good NEURO- intact CV- sinus brady, ECG shows RBBB  Good hemodynamics- DC Swan and A line  ASA, beta blocker, statin RESP- IS RENAL- creatinine and lytes OK  Weight up 8 kg, will hold off diuresis for now  ENDO- CBG mildly elevated on insulin drip   Transition to levemir + SSI GI - advance diet as tolerated Anemia secondary to ABL- received 2 units PRBC overnight  Monitor Thrombocytopenia- monitor SCD for DVT  prophylaxis, hold off on enoxaparin for now with thorombocytopenia OOB to chair Leave CT in place for now   LOS: 8 days    Loreli Slot 05/04/2021

## 2021-05-04 NOTE — Progress Notes (Addendum)
[address]       [place] [address]             [phone]      1 Day Post-Op  Procedure(s) (LRB): CORONARY ARTERY BYPASS GRAFTING (CABG) TIMES TWO ON PUMP USING ENDOSCOPICALLY HARVESTED RIGHT GREATER SAPHENOUS VEIN (N/A) MITRAL VALVE REPAIR USING MEDTRONIC SIMUFORM ANNULOPLASTY RING (MVR) (N/A) TRANSESOPHAGEAL ECHOCARDIOGRAM (TEE) (N/A) ENDOVEIN HARVEST OF GREATER SAPHENOUS VEIN (Right) APPLICATION OF CELL SAVER   Total Length of Stay:  LOS: 8 days    SUBJECTIVE:  Vitals:   05/04/21 1123 05/04/21 1200  BP:  (!) 107/57  Pulse:  89  Resp:  20  Temp: 98.5 F (36.9 C)   SpO2:  93%    Intake/Output      09/28 0701 09/29 0700 09/29 0701 09/30 0700   P.O. 50 450   I.V. (mL/kg) 3685.2 (49.5)    Blood 475    IV Piggyback 2285.2    Total Intake(mL/kg) 6495.4 (87.2) 450 (6)   Urine (mL/kg/hr) 4155 (2.3) 1000 (1.6)   Emesis/NG output     Blood 750    Chest Tube 890 150   Total Output 5795 1150   Net +700.4 -700            sodium chloride 20 mL/hr at 05/04/21 0400   sodium chloride 250 mL (05/04/21 0548)   sodium chloride 10 mL/hr at 05/03/21 1541    ceFAZolin (ANCEF) IV 2 g (05/04/21 1456)   dexmedetomidine (PRECEDEX) IV infusion Stopped (05/03/21 1750)   DOPamine 1 mcg/kg/min (05/03/21 2018)   insulin Stopped (05/04/21 0917)   lactated ringers     lactated ringers 20 mL/hr at 05/04/21 0400   nitroGLYCERIN     phenylephrine (NEO-SYNEPHRINE) Adult infusion Stopped (05/04/21 0334)    CBC    Component Value Date/Time   WBC 12.9 (H) 05/04/2021 0317   RBC 2.50 (L) 05/04/2021 0317   HGB 7.4 (L) 05/04/2021 0317   HCT 21.9 (L) 05/04/2021 0317   PLT 94 (L) 05/04/2021 0317   MCV 87.6 05/04/2021 0317   MCH 29.6 05/04/2021 0317   MCHC 33.8 05/04/2021 0317   RDW 14.3 05/04/2021 0317   CMP     Component Value Date/Time   NA 133 (L) 05/04/2021 0317   K 4.6 05/04/2021 0317   CL 107 05/04/2021 0317   CO2 21 (L) 05/04/2021 0317    GLUCOSE 126 (H) 05/04/2021 0317   BUN 12 05/04/2021 0317   CREATININE 0.72 05/04/2021 0317   CALCIUM 7.7 (L) 05/04/2021 0317   PROT 6.3 (L) 05/03/2021 0015   ALBUMIN 3.4 (L) 05/03/2021 0015   AST 16 05/03/2021 0015   ALT 20 05/03/2021 0015   ALKPHOS 84 05/03/2021 0015   BILITOT 0.7 05/03/2021 0015   GFRNONAA >60 05/04/2021 0317   ABG    Component Value Date/Time   PHART 7.380 05/03/2021 2322   PCO2ART 39.7 05/03/2021 2322   PO2ART 139 (H) 05/03/2021 2322   HCO3 23.3 05/03/2021 2322   TCO2 24 05/03/2021 2322   ACIDBASEDEF 1.0 05/03/2021 2322   O2SAT 99.0 05/03/2021 2322   CBG (last 3)  Recent Labs    05/04/21 0804 05/04/21 0915 05/04/21 1124  GLUCAP 134* 102* 152*    ASSESSMENT: Chest tube output 150 cc thus far today Nurse about to remove chest tubes Patient denies nausea Anemia-H and H improved with previous transfusion yesterday. Also, has thrombocytopenia. Continue to monitor  Ardelle Balls, PA-C  05/04/2021 15:11  Agree with above.  Hbg stable at 7.2 this pm compared to 7.4 this am  Diuresing well today. K+ 4.6 this evening.

## 2021-05-05 ENCOUNTER — Other Ambulatory Visit: Payer: Self-pay | Admitting: Cardiology

## 2021-05-05 ENCOUNTER — Inpatient Hospital Stay (HOSPITAL_COMMUNITY): Payer: Medicare Other

## 2021-05-05 DIAGNOSIS — I050 Rheumatic mitral stenosis: Secondary | ICD-10-CM

## 2021-05-05 LAB — BASIC METABOLIC PANEL
Anion gap: 5 (ref 5–15)
BUN: 22 mg/dL (ref 8–23)
CO2: 24 mmol/L (ref 22–32)
Calcium: 8 mg/dL — ABNORMAL LOW (ref 8.9–10.3)
Chloride: 103 mmol/L (ref 98–111)
Creatinine, Ser: 0.87 mg/dL (ref 0.61–1.24)
GFR, Estimated: >60 mL/min (ref >60)
Glucose, Bld: 58 mg/dL — ABNORMAL LOW (ref 70–99)
Potassium: 3.7 mmol/L (ref 3.5–5.1)
Sodium: 132 mmol/L — ABNORMAL LOW (ref 135–145)

## 2021-05-05 LAB — GLUCOSE, CAPILLARY
Glucose-Capillary: 50 mg/dL — ABNORMAL LOW (ref 70–99)
Glucose-Capillary: 83 mg/dL (ref 70–99)
Glucose-Capillary: 52 mg/dL — ABNORMAL LOW (ref 70–99)
Glucose-Capillary: 86 mg/dL (ref 70–99)
Glucose-Capillary: 138 mg/dL — ABNORMAL HIGH (ref 70–99)
Glucose-Capillary: 184 mg/dL — ABNORMAL HIGH (ref 70–99)
Glucose-Capillary: 224 mg/dL — ABNORMAL HIGH (ref 70–99)

## 2021-05-05 LAB — CBC
HCT: 19.9 % — ABNORMAL LOW (ref 39.0–52.0)
Hemoglobin: 7 g/dL — ABNORMAL LOW (ref 13.0–17.0)
MCH: 30.7 pg (ref 26.0–34.0)
MCHC: 35.2 g/dL (ref 30.0–36.0)
MCV: 87.3 fL (ref 80.0–100.0)
Platelets: 102 10*3/uL — ABNORMAL LOW (ref 150–400)
RBC: 2.28 MIL/uL — ABNORMAL LOW (ref 4.22–5.81)
RDW: 14.9 % (ref 11.5–15.5)
WBC: 14 10*3/uL — ABNORMAL HIGH (ref 4.0–10.5)
nRBC: 0 % (ref 0.0–0.2)

## 2021-05-05 LAB — PREPARE RBC (CROSSMATCH)

## 2021-05-05 MED ORDER — SODIUM CHLORIDE 0.9% IV SOLUTION: Freq: Once | INTRAVENOUS | Status: AC

## 2021-05-05 MED ORDER — POTASSIUM CHLORIDE CRYS ER 10 MEQ PO TBCR
20.0000 meq | EXTENDED_RELEASE_TABLET | Freq: Every day | ORAL | Status: DC
Start: 2021-05-06 — End: 2021-05-09
  Administered 2021-05-06 – 2021-05-09 (×4): 20 meq via ORAL
  Filled 2021-05-05 (×6): qty 2

## 2021-05-05 MED ORDER — FUROSEMIDE 10 MG/ML IJ SOLN
20.0000 mg | Freq: Once | INTRAMUSCULAR | Status: AC
  Administered 2021-05-05: 20 mg via INTRAVENOUS
  Filled 2021-05-05: qty 2

## 2021-05-05 MED ORDER — SODIUM CHLORIDE 0.9% FLUSH: 3.0000 mL | INTRAVENOUS | Status: DC | PRN

## 2021-05-05 MED ORDER — SODIUM CHLORIDE 0.9% FLUSH
3.0000 mL | Freq: Two times a day (BID) | INTRAVENOUS | Status: DC
  Administered 2021-05-05 – 2021-05-08 (×8): 3 mL via INTRAVENOUS

## 2021-05-05 MED ORDER — INSULIN ASPART 100 UNIT/ML IJ SOLN
0.0000 [IU] | Freq: Three times a day (TID) | INTRAMUSCULAR | Status: DC
  Administered 2021-05-05: 1 [IU] via SUBCUTANEOUS
  Administered 2021-05-05: 2 [IU] via SUBCUTANEOUS
  Administered 2021-05-06 – 2021-05-07 (×3): 3 [IU] via SUBCUTANEOUS
  Administered 2021-05-07: 2 [IU] via SUBCUTANEOUS
  Administered 2021-05-08: 3 [IU] via SUBCUTANEOUS
  Administered 2021-05-08 (×2): 2 [IU] via SUBCUTANEOUS
  Administered 2021-05-09: 7 [IU] via SUBCUTANEOUS

## 2021-05-05 MED ORDER — VITAMIN B-12 100 MCG PO TABS
100.0000 ug | ORAL_TABLET | Freq: Every day | ORAL | Status: DC
  Administered 2021-05-05 – 2021-05-09 (×5): 100 ug via ORAL
  Filled 2021-05-05 (×5): qty 1

## 2021-05-05 MED ORDER — SODIUM CHLORIDE 0.9 % IV SOLN: 250.0000 mL | INTRAVENOUS | Status: DC | PRN

## 2021-05-05 MED ORDER — ENOXAPARIN SODIUM 40 MG/0.4ML IJ SOSY
40.0000 mg | PREFILLED_SYRINGE | INTRAMUSCULAR | Status: DC
Start: 2021-05-05 — End: 2021-05-09
  Administered 2021-05-05 – 2021-05-09 (×5): 40 mg via SUBCUTANEOUS
  Filled 2021-05-05 (×5): qty 0.4

## 2021-05-05 MED ORDER — ALUM & MAG HYDROXIDE-SIMETH 200-200-20 MG/5ML PO SUSP: 15.0000 mL | Freq: Four times a day (QID) | ORAL | Status: DC | PRN

## 2021-05-05 MED ORDER — ~~LOC~~ CARDIAC SURGERY, PATIENT & FAMILY EDUCATION: Freq: Once | Status: AC

## 2021-05-05 MED ORDER — FUROSEMIDE 40 MG PO TABS
40.0000 mg | ORAL_TABLET | Freq: Every day | ORAL | Status: DC
  Administered 2021-05-06 – 2021-05-09 (×4): 40 mg via ORAL
  Filled 2021-05-05 (×4): qty 1

## 2021-05-05 MED ORDER — POTASSIUM CHLORIDE CRYS ER 20 MEQ PO TBCR
20.0000 meq | EXTENDED_RELEASE_TABLET | ORAL | Status: DC
  Administered 2021-05-05: 20 meq via ORAL
  Filled 2021-05-05: qty 1

## 2021-05-05 MED ORDER — DEXTROSE 50 % IV SOLN
INTRAVENOUS | Status: AC
  Administered 2021-05-05: 50 mL via INTRAVENOUS
  Filled 2021-05-05: qty 50

## 2021-05-05 MED ORDER — MAGNESIUM HYDROXIDE 400 MG/5ML PO SUSP: 30.0000 mL | Freq: Every day | ORAL | Status: DC | PRN

## 2021-05-05 MED ORDER — VITAMIN D 25 MCG (1000 UNIT) PO TABS
1000.0000 [IU] | ORAL_TABLET | Freq: Every day | ORAL | Status: DC
  Administered 2021-05-05 – 2021-05-09 (×5): 1000 [IU] via ORAL
  Filled 2021-05-05 (×5): qty 1

## 2021-05-05 NOTE — Op Note (Signed)
Joseph Robles, PRISK MEDICAL RECORD NO: [id number] ACCOUNT NO: [id number] DATE OF BIRTH: 11-05-1950 FACILITY: [facility] LOCATION: [id number] PHYSICIAN: Salvatore Decent. Dorris Fetch, MD  Operative Report   DATE OF PROCEDURE: 05/03/2021  PREOPERATIVE DIAGNOSES:  Two-vessel coronary artery disease and type 3B severe mitral regurgitation.  POSTOPERATIVE DIAGNOSES:  Two-vessel coronary artery disease and type 3B severe mitral regurgitation.  PROCEDURE PERFORMED:   Median sternotomy, extracorporeal circulation Coronary artery bypass grafting x2  Saphenous vein graft to obtuse marginal and  Saphenous vein graft to posterior descending, Endoscopic vein harvest right thigh and  Mitral valve repair with suture repair of cleft and placement of a Memo 3D 32 mm annuloplasty ring (model number [id number] RR, serial number [id number]).  SURGEON:  Charlett Lango, MD  ASSISTANT:  Doree Fudge, PA.  ANESTHESIA:  General.  FINDINGS:  Severe mitral regurgitation. Small cleft P2-P3; repaired with sutures.  No residual mitral regurgitation.  Good quality vein and good quality target vessels.  CLINICAL NOTE:  Joseph Robles is a 70 year old gentleman who presented with a syncopal episode.  Workup revealed severe mitral regurgitation.  He ruled out for MI, but at catheterization, he was found to have total occlusion of the RCA and an 80% proximal  circumflex stenosis.  There was no hemodynamically significant disease in the LAD.  He was advised to undergo coronary bypass grafting and mitral valve repair.  The indications, risks, benefits, and alternatives were discussed in detail with the patient.   He understood and accepted the risks and agreed to proceed.  OPERATIVE NOTE:  Joseph Robles was brought to the preoperative holding area on 05/03/2021.  Anesthesia placed an arterial blood pressure monitoring line and Swan-Ganz catheter.  He was taken to the operating room, anesthetized and intubated.  He had a  suprapubic catheter in  place, so no Foley catheter was placed.  Transesophageal echocardiography was performed.  It revealed an ejection fraction of 50-55%.  There was severe mitral regurgitation.  The chest, abdomen and legs were prepped and draped in  the usual sterile fashion.  Median sternotomy was performed.  Simultaneously, an incision was made in the medial aspect of the right leg at the level of the knee.  The greater saphenous vein was harvested endoscopically from the right thigh.  A timeout was performed prior to making  incision.  The patient was fully heparinized.  The pericardium was opened.  The ascending aorta was inspected.  It was normal in appearance.  The aorta was cannulated via concentric 2-0 Ethibond pledgeted pursestring sutures.  A 28-French right angle  metal tip cannula was placed via pursestring suture in the superior vena cava.  Cardiopulmonary bypass was initiated.  Anticoagulation was monitored with ACT measurement.  Flows were maintained per protocol.  The patient was cooled to 32 degrees Celsius.   A 36-French malleable cannula was placed in the inferior aspect of the right atrium and directed into the inferior vena cava.  This was connected to the bypass circuit and flows were maintained per protocol.  The coronary arteries were inspected and  anastomotic sites were chosen.  The conduits were inspected and prepared.  A retrograde cardioplegia cannula was placed via pursestring suture in the right atrium and directed into the coronary sinus.  An antegrade cardioplegia cannula was placed in the  ascending aorta.  A temperature probe was placed in the myocardial septum. Carbon dioxide was insufflated into the operative field.  The aorta was cross clamped.  The left ventricle was emptied via the aortic root vent.  Cardiac arrest then was achieved with a combination of cold antegrade and retrograde blood cardioplegia and topical iced saline.  Initially, cardioplegia was given  antegrade.  There was a  rapid diastolic arrest and then additional cardioplegia was administered retrograde and there was septal cooling to 10 degrees Celsius.  A reversed saphenous vein graft was placed end-to-side of the posterior descending branch of the right coronary.  This was a 1.5 mm good quality target vessel.  The vein was of good quality.  The end-to-side anastomosis was performed with a running 7-0  Prolene suture.  Cardioplegia was administered down the graft and there was good flow through the graft and good hemostasis.  Next, a reversed saphenous vein graft was placed end-to-side to the obtuse marginal.  The first obtuse marginal was a 1.5 mm good quality target vessel.  The vein was of good quality.  The end-to-side anastomosis was performed with a running 7-0 Prolene  suture.  Again, there was good flow and good hemostasis with cardioplegia administration.  Additional cardioplegia was administered retrograde.  The interatrial groove was dissected out and the left atriotomy was performed.  A retractor was placed.  The left atrium was enlarged.  The mitral valve was inspected.  There was no prolapse.  There  was a cleft between P2 and P3.  This was repaired with 4-0 Ethibond figure-of-eight sutures.  The annulus sized for a 32 mm Memo 3D annuloplasty ring.  2-0 Ethibond horizontal mattress sutures were placed circumferentially around the annulus. It should  be noted that cardioplegia was administered at 20-minute intervals during the open portion of the procedure.  This was done through the retrograde catheter.  The sutures were placed through the sewing ring of the annuloplasty ring, which was then lowered  into place and the sutures were sequentially tied.  The left ventricle was distended with iced saline and there was no residual mitral regurgitation noted.  Rewarming was begun.  The left atriotomy was closed with 2 layers of running 4-0 Prolene suture, the first was a running horizontal mattress suture  followed by a running simple suture.  De-airing was performed after performing the first layer of the closure.  Additional cardioplegia was administered.  The vein grafts were cut to length.  A proximal vein graft anastomoses were performed to 4.5 mm punch aortotomies with running 6-0 Prolene sutures.  At the completion of the final proximal anastomosis, the  patient was placed in Trendelenburg position.  Lidocaine was administered.  The aortic root was de-aired and the aortic crossclamp was removed.  The total crossclamp time was 114 minutes.  While rewarming was completed, the proximal and distal anastomoses and left atriotomy were inspected for hemostasis.  Epicardial pacing wires were placed on the right ventricle and right atrium and DDD pacing was initiated.  Inspection with  transesophageal echocardiography revealed no mitral regurgitation and the patient was placed back on full support.  There was some air in the ventricle.  This was deaired by placing an Angiocath through the apex.  The superior vena caval cannula was  redirected into the right atrium.  The inferior vena caval cannula and retrograde catheter were removed.  The patient then was weaned from cardiopulmonary bypass without difficulty.  The total bypass time was 186 minutes.  A test dose of protamine was  administered and was well tolerated.  The atrial and aortic cannulae were removed.  The remainder of the protamine was administered without incident.  Post-bypass transesophageal echocardiography showed trace mitral  regurgitation and no change in left  ventricular function.  Chest was copiously irrigated with warm saline.  Hemostasis was achieved.  Two mediastinal chest tubes were placed through separate subcostal incisions and secured with #1 silk sutures.  The pericardium was reapproximated over the  ascending aorta and base of the heart with interrupted 3-0 silk sutures.  It came together easily without tension.  The chest was  closed with a combination of single and double heavy gauge stainless steel wires.  The pectoralis fascia, subcutaneous  tissue and skin were closed in standard fashion.  After closure of the chest, the cardiac index did drop transiently.  There were no other hemodynamic changes and no changes in the transesophageal echocardiogram.  Low dose dopamine infusion was initiated  at 2 mcg per kilogram per minute.  The patient then was transported from the operating room to the surgical intensive care unit, intubated and in good condition.  All sponge, needle and instrument counts were correct at the end of the procedure.   SHW D: 05/05/2021 11:08:30 am T: 05/05/2021 9:31:00 pm  JOB: [id number]/ [id number]

## 2021-05-05 NOTE — Progress Notes (Addendum)
2 Days Post-Op Procedure(s) (LRB): CORONARY ARTERY BYPASS GRAFTING (CABG) TIMES TWO ON PUMP USING ENDOSCOPICALLY HARVESTED RIGHT GREATER SAPHENOUS VEIN (N/A) MITRAL VALVE REPAIR USING MEDTRONIC SIMUFORM ANNULOPLASTY RING (MVR) (N/A) TRANSESOPHAGEAL ECHOCARDIOGRAM (TEE) (N/A) ENDOVEIN HARVEST OF GREATER SAPHENOUS VEIN (Right) APPLICATION OF CELL SAVER Subjective: Incisional pain after trying to sleep on his side last night  Objective: Vital signs in last 24 hours: Temp:  [97.6 F (36.4 C)-98.5 F (36.9 C)] 97.6 F (36.4 C) (09/30 0650) Pulse Rate:  [88-91] 90 (09/30 0700) Cardiac Rhythm: Atrial paced (09/30 0400) Resp:  [17-30] 23 (09/30 0700) BP: (82-120)/(45-92) 103/92 (09/30 0700) SpO2:  [92 %-100 %] 95 % (09/30 0700) Weight:  [74.9 kg] 74.9 kg (09/30 0500)  Hemodynamic parameters for last 24 hours:    Intake/Output from previous day: 09/29 0701 - 09/30 0700 In: 1476.5 [P.O.:450; I.V.:726.6; IV Piggyback:299.9] Out: 1975 [Urine:1825; Chest Tube:150] Intake/Output this shift: No intake/output data recorded.  General appearance: alert, cooperative, and no distress Neurologic: intact Heart: regular rate and rhythm Lungs: clear to auscultation bilaterally Abdomen: normal findings: soft, non-tender Wound: clean, some serosanguinous drainage over night   Lab Results: Recent Labs    05/04/21 1732 05/05/21 0354  WBC 14.4* 14.0*  HGB 7.2* 7.0*  HCT 21.4* 19.9*  PLT 93* 102*   BMET:  Recent Labs    05/04/21 1732 05/05/21 0354  NA 131* 132*  K 4.6 3.7  CL 102 103  CO2 22 24  GLUCOSE 257* 58*  BUN 20 22  CREATININE 1.07 0.87  CALCIUM 8.2* 8.0*    PT/INR:  Recent Labs    05/03/21 1428  LABPROT 18.5*  INR 1.5*   ABG    Component Value Date/Time   PHART 7.380 05/03/2021 2322   HCO3 23.3 05/03/2021 2322   TCO2 24 05/03/2021 2322   ACIDBASEDEF 1.0 05/03/2021 2322   O2SAT 99.0 05/03/2021 2322   CBG (last 3)  Recent Labs    05/05/21 0430  05/05/21 0649 05/05/21 0711  GLUCAP 83 52* 86    Assessment/Plan: S/P Procedure(s) (LRB): CORONARY ARTERY BYPASS GRAFTING (CABG) TIMES TWO ON PUMP USING ENDOSCOPICALLY HARVESTED RIGHT GREATER SAPHENOUS VEIN (N/A) MITRAL VALVE REPAIR USING MEDTRONIC SIMUFORM ANNULOPLASTY RING (MVR) (N/A) TRANSESOPHAGEAL ECHOCARDIOGRAM (TEE) (N/A) ENDOVEIN HARVEST OF GREATER SAPHENOUS VEIN (Right) APPLICATION OF CELL SAVER Plan for transfer to step-down: see transfer orders POD # 2 NEURO- intact CV- in Sr with short PR  Change pacer to VVI backup  ASA, statin, beta blocker RESP- continue IS RENAL- creatinine and lytes OK  Supplement K  Diurese after transfusion HEME- anemia- will transfuse1 unit PRBC  Thrombocytopenia- PLT > 100K GI- tolerating PO ENDO - hyperglycemic yesterday then hypoglycemic overnight  Dc Levemir, change SSI to Ac and HS  Hold off on restarting metformin and Januvia SCD + enoxaparin for DVT prophylaxis Cardiac rehab     LOS: 9 days    Loreli Slot 05/05/2021

## 2021-05-06 ENCOUNTER — Inpatient Hospital Stay (HOSPITAL_COMMUNITY): Payer: Medicare Other

## 2021-05-06 LAB — BASIC METABOLIC PANEL
Anion gap: 7 (ref 5–15)
BUN: 26 mg/dL — ABNORMAL HIGH (ref 8–23)
CO2: 23 mmol/L (ref 22–32)
Calcium: 7.9 mg/dL — ABNORMAL LOW (ref 8.9–10.3)
Chloride: 101 mmol/L (ref 98–111)
Creatinine, Ser: 0.93 mg/dL (ref 0.61–1.24)
GFR, Estimated: >60 mL/min (ref >60)
Glucose, Bld: 177 mg/dL — ABNORMAL HIGH (ref 70–99)
Potassium: 4.4 mmol/L (ref 3.5–5.1)
Sodium: 131 mmol/L — ABNORMAL LOW (ref 135–145)

## 2021-05-06 LAB — TYPE AND SCREEN
ABO/RH(D): O NEG
Antibody Screen: NEGATIVE
Unit division: 0
Unit division: 0
Unit division: 0

## 2021-05-06 LAB — GLUCOSE, CAPILLARY
Glucose-Capillary: 182 mg/dL — ABNORMAL HIGH (ref 70–99)
Glucose-Capillary: 222 mg/dL — ABNORMAL HIGH (ref 70–99)
Glucose-Capillary: 222 mg/dL — ABNORMAL HIGH (ref 70–99)
Glucose-Capillary: 201 mg/dL — ABNORMAL HIGH (ref 70–99)

## 2021-05-06 LAB — BPAM RBC
Blood Product Expiration Date: 202210042359
Blood Product Expiration Date: 202210052359
Blood Product Expiration Date: 202210052359
ISSUE DATE / TIME: 202209282130
ISSUE DATE / TIME: 202209290059
ISSUE DATE / TIME: 202209300916
Unit Type and Rh: 9500
Unit Type and Rh: 9500
Unit Type and Rh: 9500

## 2021-05-06 LAB — CBC
HCT: 22.8 % — ABNORMAL LOW (ref 39.0–52.0)
Hemoglobin: 7.7 g/dL — ABNORMAL LOW (ref 13.0–17.0)
MCH: 29.7 pg (ref 26.0–34.0)
MCHC: 33.8 g/dL (ref 30.0–36.0)
MCV: 88 fL (ref 80.0–100.0)
Platelets: 103 10*3/uL — ABNORMAL LOW (ref 150–400)
RBC: 2.59 MIL/uL — ABNORMAL LOW (ref 4.22–5.81)
RDW: 15.3 % (ref 11.5–15.5)
WBC: 9.5 10*3/uL (ref 4.0–10.5)
nRBC: 0 % (ref 0.0–0.2)

## 2021-05-06 MED ORDER — METFORMIN HCL 500 MG PO TABS
1000.0000 mg | ORAL_TABLET | Freq: Two times a day (BID) | ORAL | Status: DC
  Administered 2021-05-06 – 2021-05-09 (×7): 1000 mg via ORAL
  Filled 2021-05-06 (×8): qty 2

## 2021-05-06 MED ORDER — FERROUS GLUCONATE 324 (38 FE) MG PO TABS
324.0000 mg | ORAL_TABLET | Freq: Two times a day (BID) | ORAL | Status: DC
  Administered 2021-05-06 – 2021-05-09 (×7): 324 mg via ORAL
  Filled 2021-05-06 (×9): qty 1

## 2021-05-06 NOTE — Progress Notes (Signed)
CARDIAC REHAB PHASE I   PRE:  Rate/Rhythm: 66 SR    BP: sitting 121/74    SaO2: 100 RA  MODE:  Ambulation: 200 ft   POST:  Rate/Rhythm: 69 SR    BP: sitting 140/75     SaO2: 91 RA  1120-1145 Patient ambulated in hallway with RW and 1 assist. Slow steady gait. Standing RB x 1 for "lack of energy". Denies all other complaints. Post ambulation patient to chair with wife at side. Call bell and phone in reach. Assisted with setting up lunch tray. Encouraged IS use. Provided patient with proper instructions for sternal support and pillow use. Encouraged additional ambulation with staff later today. Will continue to follow and provide ambulation and education support prior to d/c.    Talaya Lamprecht Hoover Brunette RN, BSN

## 2021-05-06 NOTE — Progress Notes (Signed)
Pt ambulated in hallway with walker 325 feet. Pt tolerated well. Returned to bed, call light in reach.  Versie Starks, RN
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
## 2021-05-07 LAB — GLUCOSE, CAPILLARY
Glucose-Capillary: 157 mg/dL — ABNORMAL HIGH (ref 70–99)
Glucose-Capillary: 175 mg/dL — ABNORMAL HIGH (ref 70–99)
Glucose-Capillary: 206 mg/dL — ABNORMAL HIGH (ref 70–99)
Glucose-Capillary: 210 mg/dL — ABNORMAL HIGH (ref 70–99)
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
## 2021-05-07 NOTE — Progress Notes (Signed)
EPW removed per protocol VS stable Tips intact Patient educated about 1hr bedrest.

## 2021-05-08 LAB — BASIC METABOLIC PANEL
Anion gap: 6 (ref 5–15)
BUN: 27 mg/dL — ABNORMAL HIGH (ref 8–23)
CO2: 27 mmol/L (ref 22–32)
Calcium: 8.1 mg/dL — ABNORMAL LOW (ref 8.9–10.3)
Chloride: 101 mmol/L (ref 98–111)
Creatinine, Ser: 0.81 mg/dL (ref 0.61–1.24)
GFR, Estimated: >60 mL/min (ref >60)
Glucose, Bld: 165 mg/dL — ABNORMAL HIGH (ref 70–99)
Potassium: 3.9 mmol/L (ref 3.5–5.1)
Sodium: 134 mmol/L — ABNORMAL LOW (ref 135–145)

## 2021-05-08 LAB — CBC
HCT: 21.6 % — ABNORMAL LOW (ref 39.0–52.0)
Hemoglobin: 7.1 g/dL — ABNORMAL LOW (ref 13.0–17.0)
MCH: 29.8 pg (ref 26.0–34.0)
MCHC: 32.9 g/dL (ref 30.0–36.0)
MCV: 90.8 fL (ref 80.0–100.0)
Platelets: 165 10*3/uL (ref 150–400)
RBC: 2.38 MIL/uL — ABNORMAL LOW (ref 4.22–5.81)
RDW: 15.1 % (ref 11.5–15.5)
WBC: 8.3 10*3/uL (ref 4.0–10.5)
nRBC: 0 % (ref 0.0–0.2)

## 2021-05-08 LAB — GLUCOSE, CAPILLARY
Glucose-Capillary: 181 mg/dL — ABNORMAL HIGH (ref 70–99)
Glucose-Capillary: 210 mg/dL — ABNORMAL HIGH (ref 70–99)
Glucose-Capillary: 182 mg/dL — ABNORMAL HIGH (ref 70–99)
Glucose-Capillary: 156 mg/dL — ABNORMAL HIGH (ref 70–99)

## 2021-05-08 LAB — PREPARE RBC (CROSSMATCH)

## 2021-05-08 LAB — MAGNESIUM: Magnesium: 1.7 mg/dL (ref 1.7–2.4)

## 2021-05-08 MED ORDER — LINAGLIPTIN 5 MG PO TABS
5.0000 mg | ORAL_TABLET | Freq: Every day | ORAL | Status: DC
Start: 2021-05-08 — End: 2021-05-09
  Administered 2021-05-08 – 2021-05-09 (×2): 5 mg via ORAL
  Filled 2021-05-08 (×2): qty 1

## 2021-05-08 MED ORDER — SODIUM CHLORIDE 0.9% IV SOLUTION: Freq: Once | INTRAVENOUS | Status: AC

## 2021-05-08 MED ORDER — DIPHENHYDRAMINE HCL 25 MG PO CAPS
25.0000 mg | ORAL_CAPSULE | Freq: Four times a day (QID) | ORAL | Status: DC | PRN
  Administered 2021-05-08 – 2021-05-09 (×2): 25 mg via ORAL
  Filled 2021-05-08 (×2): qty 1

## 2021-05-08 MED ORDER — HYDROCORTISONE 1 % EX CREA
1.0000 "application " | TOPICAL_CREAM | Freq: Four times a day (QID) | CUTANEOUS | Status: DC | PRN
  Filled 2021-05-08: qty 28

## 2021-05-08 NOTE — Progress Notes (Signed)
Mobility Specialist Progress Note:   05/08/21 1138  Therapy Vitals  Pulse Rate 65  Mobility  Activity Ambulated in hall  Range of Motion/Exercises Active;All extremities  Level of Assistance Minimal assist, patient does 75% or more  Assistive Device Front wheel walker  Minutes Ambulated 8 minutes  Distance Ambulated (ft) 500 ft  Mobility Ambulated with assistance in hallway  Mobility Response Tolerated well  Mobility performed by Mobility specialist  Transport method Ambulatory  $Mobility charge 1 Mobility   Pre Mobility: HR 65 bpm During Mobility: HR 70 bpm Post Mobility: HR 69 bpm; SpO2 97%; BP 136/68  Pt was received in bed agreed to mobility. Ambulated 500' in hallway with RW and supervision. Required x1 standing rest break due to fatigue. Required minA to stand and multiple cues for hand placement with sternal precautions. Pt left in bed with all needs met and RN, and family present.   Nelta Numbers Mobility Specialist  Phone [phone]

## 2021-05-08 NOTE — Progress Notes (Addendum)
[address]       [place] [address]             [phone]      5 Days Post-Op Procedure(s) (LRB): CORONARY ARTERY BYPASS GRAFTING (CABG) TIMES TWO ON PUMP USING ENDOSCOPICALLY HARVESTED RIGHT GREATER SAPHENOUS VEIN (N/A) MITRAL VALVE REPAIR USING MEDTRONIC SIMUFORM ANNULOPLASTY RING (MVR) (N/A) TRANSESOPHAGEAL ECHOCARDIOGRAM (TEE) (N/A) ENDOVEIN HARVEST OF GREATER SAPHENOUS VEIN (Right) APPLICATION OF CELL SAVER Subjective: Feels weak, improving though  Objective: Vital signs in last 24 hours: Temp:  [97.7 F (36.5 C)-99 F (37.2 C)] 98 F (36.7 C) (10/03 0330) Pulse Rate:  [63-73] 64 (10/03 0330) Cardiac Rhythm: Normal sinus rhythm;Bundle branch block (10/02 1900) Resp:  [18-24] 18 (10/03 0330) BP: (102-133)/(62-75) 114/62 (10/03 0330) SpO2:  [94 %-100 %] 95 % (10/03 0330) Weight:  [71.8 kg] 71.8 kg (10/03 0600)  Hemodynamic parameters for last 24 hours:    Intake/Output from previous day: 10/02 0701 - 10/03 0700 In: -  Out: 1645 [Urine:1645] Intake/Output this shift: No intake/output data recorded.  General appearance: alert, cooperative, and no distress Heart: regular rate and rhythm Lungs: clear to auscultation bilaterally Abdomen: benign Extremities: no edema Wound: incis healing well   Lab Results: Recent Labs    05/06/21 0103 05/08/21 0122  WBC 9.5 8.3  HGB 7.7* 7.1*  HCT 22.8* 21.6*  PLT 103* 165   BMET:  Recent Labs    05/06/21 0103 05/08/21 0122  NA 131* 134*  K 4.4 3.9  CL 101 101  CO2 23 27  GLUCOSE 177* 165*  BUN 26* 27*  CREATININE 0.93 0.81  CALCIUM 7.9* 8.1*    PT/INR: No results for input(s): LABPROT, INR in the last 72 hours. ABG    Component Value Date/Time   PHART 7.380 05/03/2021 2322   HCO3 23.3 05/03/2021 2322   TCO2 24 05/03/2021 2322   ACIDBASEDEF 1.0 05/03/2021 2322   O2SAT 99.0 05/03/2021 2322   CBG (last 3)  Recent Labs    05/07/21 1609 05/07/21 2050 05/08/21 0610  GLUCAP  206* 210* 181*    Meds Scheduled Meds:  sodium chloride   Intravenous Once   sodium chloride   Intravenous Once   aspirin EC  325 mg Oral Daily   Or   aspirin  324 mg Per Tube Daily   atorvastatin  80 mg Oral QHS   bisacodyl  10 mg Oral Daily   Or   bisacodyl  10 mg Rectal Daily   Chlorhexidine Gluconate Cloth  6 each Topical Daily   cholecalciferol  1,000 Units Oral Daily   docusate sodium  200 mg Oral Daily   enoxaparin (LOVENOX) injection  40 mg Subcutaneous Q24H   feeding supplement (GLUCERNA SHAKE)  237 mL Oral TID BM   ferrous gluconate  324 mg Oral BID WC   furosemide  40 mg Oral Daily   insulin aspart  0-9 Units Subcutaneous TID WC   metFORMIN  1,000 mg Oral BID WC   metoprolol tartrate  12.5 mg Oral BID   Or   metoprolol tartrate  12.5 mg Per Tube BID   multivitamin with minerals  1 tablet Oral Daily   pantoprazole  40 mg Oral Daily   potassium chloride  20 mEq Oral Daily   sodium chloride flush  3 mL Intravenous Q12H   cyanocobalamin  100 mcg Oral Daily   Continuous Infusions:  sodium chloride     PRN Meds:.sodium chloride, alum &  mag hydroxide-simeth, Gerhardt's butt cream, magnesium hydroxide, ondansetron (ZOFRAN) IV, oxybutynin, oxyCODONE, sodium chloride flush, zolpidem  Xrays No results found.  Assessment/Plan: S/P Procedure(s) (LRB): CORONARY ARTERY BYPASS GRAFTING (CABG) TIMES TWO ON PUMP USING ENDOSCOPICALLY HARVESTED RIGHT GREATER SAPHENOUS VEIN (N/A) MITRAL VALVE REPAIR USING MEDTRONIC SIMUFORM ANNULOPLASTY RING (MVR) (N/A) TRANSESOPHAGEAL ECHOCARDIOGRAM (TEE) (N/A) ENDOVEIN HARVEST OF GREATER SAPHENOUS VEIN (Right) APPLICATION OF CELL SAVER  1 afeb, VSS, sinus rhythm 2 sats good on RA 3 good UOP, weight trending lower 2 kg/24 if accurate 4 H/H fairly stable 7.1/21.6- with weakness could consider transfusion as may be contributing some 5 normal renal fxn 6 BS elevated- will resume home Januvia (tradjenta is substituted)   Addendum- d/w  Dr Dorris Fetch, will transfuse one unit PRBC's  LOS: 12 days    Rowe Clack PA-C Pager [phone] 05/08/2021

## 2021-05-08 NOTE — Progress Notes (Signed)
Pt has ambulated x2 today, doing well, just tired. Encouraged another walk later. He is contemplating ADs for home. Will ambulate tomorrow to advise. Practicing IS. Wife present. [phone] Ethelda Chick CES, ACSM 2:38 PM 05/08/2021

## 2021-05-09 LAB — GLUCOSE, CAPILLARY
Glucose-Capillary: 149 mg/dL — ABNORMAL HIGH (ref 70–99)
Glucose-Capillary: 304 mg/dL — ABNORMAL HIGH (ref 70–99)

## 2021-05-09 LAB — TYPE AND SCREEN
ABO/RH(D): O NEG
Antibody Screen: NEGATIVE
Unit division: 0

## 2021-05-09 LAB — CBC
HCT: 26.8 % — ABNORMAL LOW (ref 39.0–52.0)
Hemoglobin: 8.9 g/dL — ABNORMAL LOW (ref 13.0–17.0)
MCH: 29.6 pg (ref 26.0–34.0)
MCHC: 33.2 g/dL (ref 30.0–36.0)
MCV: 89 fL (ref 80.0–100.0)
Platelets: 198 10*3/uL (ref 150–400)
RBC: 3.01 MIL/uL — ABNORMAL LOW (ref 4.22–5.81)
RDW: 15.6 % — ABNORMAL HIGH (ref 11.5–15.5)
WBC: 9.9 10*3/uL (ref 4.0–10.5)
nRBC: 0 % (ref 0.0–0.2)

## 2021-05-09 LAB — BPAM RBC
Blood Product Expiration Date: 202210212359
ISSUE DATE / TIME: 202210031205
Unit Type and Rh: 9500

## 2021-05-09 MED ORDER — OXYCODONE HCL 5 MG PO TABS: 5.0000 mg | ORAL_TABLET | ORAL | 0 refills | Status: AC | PRN

## 2021-05-09 MED ORDER — OXYBUTYNIN CHLORIDE 5 MG PO TABS
5.0000 mg | ORAL_TABLET | Freq: Three times a day (TID) | ORAL | Status: DC | PRN
  Administered 2021-05-09: 5 mg via ORAL
  Filled 2021-05-09 (×2): qty 1

## 2021-05-09 MED ORDER — METOPROLOL SUCCINATE ER 25 MG PO TB24: 25.0000 mg | ORAL_TABLET | Freq: Every day | ORAL | 2 refills | Status: DC

## 2021-05-09 MED ORDER — FERROUS GLUCONATE 324 (38 FE) MG PO TABS: 324.0000 mg | ORAL_TABLET | Freq: Two times a day (BID) | ORAL | 3 refills | Status: AC

## 2021-05-09 MED ORDER — ASPIRIN 325 MG PO TBEC: 325.0000 mg | DELAYED_RELEASE_TABLET | Freq: Every day | ORAL | 0 refills | Status: DC

## 2021-05-09 MED ORDER — METOPROLOL SUCCINATE ER 25 MG PO TB24
25.0000 mg | ORAL_TABLET | Freq: Every day | ORAL | Status: DC
  Administered 2021-05-09: 25 mg via ORAL
  Filled 2021-05-09: qty 1

## 2021-05-09 NOTE — Care Management Important Message (Signed)
Important Message  Patient Details  Name: Joseph Robles MRN: [id number] Date of Birth: 02/11/1951   Medicare Important Message Given:  Yes     Renie Ora 05/09/2021, 8:40 AM

## 2021-05-09 NOTE — Progress Notes (Addendum)
      [address]       [place] [address]             [phone]      6 Days Post-Op Procedure(s) (LRB): CORONARY ARTERY BYPASS GRAFTING (CABG) TIMES TWO ON PUMP USING ENDOSCOPICALLY HARVESTED RIGHT GREATER SAPHENOUS VEIN (N/A) MITRAL VALVE REPAIR USING MEDTRONIC SIMUFORM ANNULOPLASTY RING (MVR) (N/A) TRANSESOPHAGEAL ECHOCARDIOGRAM (TEE) (N/A) ENDOVEIN HARVEST OF GREATER SAPHENOUS VEIN (Right) APPLICATION OF CELL SAVER Subjective: Biggest complaint is his suprapubic catheter. He feels like its not in the right position and it is bothering him.   Objective: Vital signs in last 24 hours: Temp:  [97.8 F (36.6 C)-98.7 F (37.1 C)] 98.2 F (36.8 C) (10/04 0337) Pulse Rate:  [60-74] 60 (10/04 0337) Cardiac Rhythm: Normal sinus rhythm;Bundle branch block (10/03 1956) Resp:  [16-20] 20 (10/04 0337) BP: (103-137)/(61-76) 123/76 (10/04 0337) SpO2:  [96 %-98 %] 97 % (10/04 0337) Weight:  [72.6 kg] 72.6 kg (10/04 0559)     Intake/Output from previous day: 10/03 0701 - 10/04 0700 In: 315 [Blood:315] Out: 2025 [Urine:2025] Intake/Output this shift: No intake/output data recorded.   General appearance: alert, cooperative, and no distress Heart: regular rate and rhythm, S1, S2 normal, no murmur, click, rub or gallop Lungs: clear to auscultation bilaterally Abdomen: soft, non-tender; bowel sounds normal; no masses,  no organomegaly Extremities: extremities normal, atraumatic, no cyanosis or edema Wound: clean and dry  Lab Results: Recent Labs    05/08/21 0122 05/09/21 0115  WBC 8.3 9.9  HGB 7.1* 8.9*  HCT 21.6* 26.8*  PLT 165 198   BMET:  Recent Labs    05/08/21 0122  NA 134*  K 3.9  CL 101  CO2 27  GLUCOSE 165*  BUN 27*  CREATININE 0.81  CALCIUM 8.1*    PT/INR: No results for input(s): LABPROT, INR in the last 72 hours. ABG    Component Value Date/Time   PHART 7.380 05/03/2021 2322   HCO3 23.3 05/03/2021 2322   TCO2 24 05/03/2021  2322   ACIDBASEDEF 1.0 05/03/2021 2322   O2SAT 99.0 05/03/2021 2322   CBG (last 3)  Recent Labs    05/08/21 1634 05/08/21 2141 05/09/21 0621  GLUCAP 182* 156* 149*    Assessment/Plan: S/P Procedure(s) (LRB): CORONARY ARTERY BYPASS GRAFTING (CABG) TIMES TWO ON PUMP USING ENDOSCOPICALLY HARVESTED RIGHT GREATER SAPHENOUS VEIN (N/A) MITRAL VALVE REPAIR USING MEDTRONIC SIMUFORM ANNULOPLASTY RING (MVR) (N/A) TRANSESOPHAGEAL ECHOCARDIOGRAM (TEE) (N/A) ENDOVEIN HARVEST OF GREATER SAPHENOUS VEIN (Right) APPLICATION OF CELL SAVER  CV-NSR in the 60s, BP well controlled. Continue asa, statin, and BB Pulm-tolerating room air with good oxygen saturation Renal-creatinine 0.81, electrolytes stable. Remains 4kg over baseline. Continue lasix and potassium supplementation.. H and H improved after 1 unit pRBC. 8.9/26.8 today Endo-blood glucose mostly controlled on home Januvia. Continue lovenox for DVT prophylaxis  Plan: Will get urology to come back and trouble shoot his catheter. Otherwise, he is doing well. Continue diuresis for fluid overload and continue ambulation around the halls.    LOS: 13 days    Sharlene Dory 05/09/2021  Doing well except for issues with suprapubic catheter. Will have suprapubic changed. Home later today  Salvatore Decent. Dorris Fetch, MD [facility] [phone]

## 2021-05-09 NOTE — Progress Notes (Signed)
      [address]       [name] [address]             [phone]      Mr. Dowdell said he was able to clear some sediment from the catheter tubing and it is draining better.  He said he is much more comfortable now and would like to go home.  Has has a urology follow up appointment with Dr. Marlou Porch (Urology) on Monday 05/15/21 and home health nursing is to begin tomorrow or Thursday.  A rolling walker has been delivered to his room and will also arrange a 3in1 BSC.   Plan for discharge later today.  Gaynelle Arabian, PA-C

## 2021-05-09 NOTE — Progress Notes (Signed)
CARDIAC REHAB PHASE I   PRE:  Rate/Rhythm: 70 SR    BP: sitting 119/76    SaO2: 95 RA  MODE:  Ambulation: 470 ft   POST:  Rate/Rhythm: 84 SR    BP: sitting 134/78     SaO2: 93 RA  Pt moved out of bed following sternal precautions and walked with RW. Steady. Catheter felt better walking, he will continue to evaluate today in recliner. Would like RW for home. To recliner  Discussed IS, sternal precautions, diet, exercise, smoking cessation, and CRPII. Pt voiced understanding. He plans to quit smoking. His wife quit in April. Gave resources. Will refer to [facility].  [phone]   Harriet Masson CES, ACSM 05/09/2021 10:02 AM

## 2021-05-09 NOTE — TOC Transition Note (Signed)
Transition of Care (TOC) - CM/SW Discharge Note Donn Pierini RN, BSN Transitions of Care Unit 4E- RN Case Manager See Treatment Team for direct phone #    Patient Details  Name: Joseph Robles MRN: [id number] Date of Birth: 1951/04/21  Transition of Care [facility]) CM/SW Contact:  Darrold Span, RN Phone Number: 05/09/2021, 3:32 PM   Clinical Narrative:    Pt stable for transition home today, Notified by [facility] that pt has pre-op referral protocol for [facility] needs by TCTS office. They will f/u with pt post discharge.  Orders for DME- RW and 3n1 have been placed.  Call made to [facility] with [employer] for DME needs- RW and 3n1 to be delivered to room prior to discharge.   Wife to transport home   Final next level of care: Home w Home Health Services Barriers to Discharge: No Barriers Identified   Patient Goals and CMS Choice Patient states their goals for this hospitalization and ongoing recovery are:: return home   Choice offered to / list presented to :  (pre-op referral protocol per MD office)  Discharge Placement                 Home w/ [facility]      Discharge Plan and Services   Discharge Planning Services: CM Consult Post Acute Care Choice: Home Health          DME Arranged: 3-N-1, Walker rolling DME Agency: [employer] Date DME Agency Contacted: 05/09/21 Time DME Agency Contacted: [phone] Representative spoke with at DME Agency: Francesco Sor Agency: [facility] Date [facility] Agency Contacted: 05/09/21 Time HH Agency Contacted: 1015 Representative spoke with at [facility] Agency: Misty Stanley  Social Determinants of Health (SDOH) Interventions     Readmission Risk Interventions Readmission Risk Prevention Plan 05/09/2021  Post Dischage Appt Complete  Medication Screening Complete  Transportation Screening Complete  Some recent data might be hidden

## 2021-05-09 NOTE — Progress Notes (Signed)
Mobility Specialist Progress Note:   05/09/21 1100  Therapy Vitals  Pulse Rate 78  BP (!) 112/56  Patient Position (if appropriate) Sitting  Mobility  Activity Ambulated in hall;Ambulated to bathroom  Level of Assistance Standby assist, set-up cues, supervision of patient - no hands on  Assistive Device Front wheel walker  Distance Ambulated (ft) 300 ft  Mobility Ambulated with assistance in hallway  Mobility Response Tolerated well  Mobility performed by Mobility specialist  Bed Position Chair  $Mobility charge 1 Mobility   Pre Mobility: HR 78 bpm; BP 112/56 During Mobility: HR 83 Post Mobility: HR 79 bpm; BP 112/67   Pt was received in the chair and agreed to mobility. Ambulated in hall 300' with RW and supervision. Pt voiced no complaints during ambulation and tolerated well. Did still say that cath was uncomfortable. Left in chair with all needs met.   Nelta Numbers Mobility Specialist  Phone [phone]

## 2021-05-09 NOTE — Progress Notes (Signed)
Pt has multiple concerns about discharge. Wanted to make sure he was set up with new urologist D. Herrick. I did confirm this. Wanted to make sure his suprapubic catheter was exchanged due to discomfort. After extensive conversation, pt is wanting to have his own catheter from home inserted and wife will bring as well as leg bag. Pt concerned about having RN come to home due to recent surgery and may need help with flushing catheter. Spoke with CM Kristi and per protocol will be set up with home health. Will continue to address concerns as they arise. Pt resting with call bell within reach.  Will continue to monitor.

## 2021-05-10 DIAGNOSIS — E43 Unspecified severe protein-calorie malnutrition: Secondary | ICD-10-CM | POA: Diagnosis not present

## 2021-05-10 DIAGNOSIS — Z48812 Encounter for surgical aftercare following surgery on the circulatory system: Secondary | ICD-10-CM | POA: Diagnosis not present

## 2021-05-10 DIAGNOSIS — Z87891 Personal history of nicotine dependence: Secondary | ICD-10-CM | POA: Diagnosis not present

## 2021-05-10 DIAGNOSIS — N35919 Unspecified urethral stricture, male, unspecified site: Secondary | ICD-10-CM | POA: Diagnosis not present

## 2021-05-10 DIAGNOSIS — E119 Type 2 diabetes mellitus without complications: Secondary | ICD-10-CM | POA: Diagnosis not present

## 2021-05-10 DIAGNOSIS — I10 Essential (primary) hypertension: Secondary | ICD-10-CM | POA: Diagnosis not present

## 2021-05-10 DIAGNOSIS — Z7984 Long term (current) use of oral hypoglycemic drugs: Secondary | ICD-10-CM | POA: Diagnosis not present

## 2021-05-10 DIAGNOSIS — I25810 Atherosclerosis of coronary artery bypass graft(s) without angina pectoris: Secondary | ICD-10-CM | POA: Diagnosis not present

## 2021-05-10 DIAGNOSIS — I341 Nonrheumatic mitral (valve) prolapse: Secondary | ICD-10-CM | POA: Diagnosis not present

## 2021-05-10 DIAGNOSIS — Z466 Encounter for fitting and adjustment of urinary device: Secondary | ICD-10-CM | POA: Diagnosis not present

## 2021-05-10 DIAGNOSIS — Z79891 Long term (current) use of opiate analgesic: Secondary | ICD-10-CM | POA: Diagnosis not present

## 2021-05-10 DIAGNOSIS — E785 Hyperlipidemia, unspecified: Secondary | ICD-10-CM | POA: Diagnosis not present

## 2021-05-10 DIAGNOSIS — Z7982 Long term (current) use of aspirin: Secondary | ICD-10-CM | POA: Diagnosis not present

## 2021-05-11 DIAGNOSIS — E119 Type 2 diabetes mellitus without complications: Secondary | ICD-10-CM | POA: Diagnosis not present

## 2021-05-11 DIAGNOSIS — I10 Essential (primary) hypertension: Secondary | ICD-10-CM | POA: Diagnosis not present

## 2021-05-11 DIAGNOSIS — Z7984 Long term (current) use of oral hypoglycemic drugs: Secondary | ICD-10-CM | POA: Diagnosis not present

## 2021-05-11 DIAGNOSIS — Z48812 Encounter for surgical aftercare following surgery on the circulatory system: Secondary | ICD-10-CM | POA: Diagnosis not present

## 2021-05-11 DIAGNOSIS — I25810 Atherosclerosis of coronary artery bypass graft(s) without angina pectoris: Secondary | ICD-10-CM | POA: Diagnosis not present

## 2021-05-11 DIAGNOSIS — I341 Nonrheumatic mitral (valve) prolapse: Secondary | ICD-10-CM | POA: Diagnosis not present

## 2021-05-12 ENCOUNTER — Telehealth (HOSPITAL_COMMUNITY): Payer: Self-pay

## 2021-05-12 DIAGNOSIS — E119 Type 2 diabetes mellitus without complications: Secondary | ICD-10-CM | POA: Diagnosis not present

## 2021-05-12 DIAGNOSIS — I341 Nonrheumatic mitral (valve) prolapse: Secondary | ICD-10-CM | POA: Diagnosis not present

## 2021-05-12 DIAGNOSIS — I25810 Atherosclerosis of coronary artery bypass graft(s) without angina pectoris: Secondary | ICD-10-CM | POA: Diagnosis not present

## 2021-05-12 DIAGNOSIS — Z7984 Long term (current) use of oral hypoglycemic drugs: Secondary | ICD-10-CM | POA: Diagnosis not present

## 2021-05-12 DIAGNOSIS — I10 Essential (primary) hypertension: Secondary | ICD-10-CM | POA: Diagnosis not present

## 2021-05-12 DIAGNOSIS — Z48812 Encounter for surgical aftercare following surgery on the circulatory system: Secondary | ICD-10-CM | POA: Diagnosis not present

## 2021-05-12 NOTE — Telephone Encounter (Signed)
Per phase I cardiac rehab, fax cardiac rehab referral to [facility]. °

## 2021-05-15 DIAGNOSIS — Z7984 Long term (current) use of oral hypoglycemic drugs: Secondary | ICD-10-CM | POA: Diagnosis not present

## 2021-05-15 DIAGNOSIS — I25810 Atherosclerosis of coronary artery bypass graft(s) without angina pectoris: Secondary | ICD-10-CM | POA: Diagnosis not present

## 2021-05-15 DIAGNOSIS — I341 Nonrheumatic mitral (valve) prolapse: Secondary | ICD-10-CM | POA: Diagnosis not present

## 2021-05-15 DIAGNOSIS — E119 Type 2 diabetes mellitus without complications: Secondary | ICD-10-CM | POA: Diagnosis not present

## 2021-05-15 DIAGNOSIS — I10 Essential (primary) hypertension: Secondary | ICD-10-CM | POA: Diagnosis not present

## 2021-05-15 DIAGNOSIS — Z48812 Encounter for surgical aftercare following surgery on the circulatory system: Secondary | ICD-10-CM | POA: Diagnosis not present

## 2021-05-16 DIAGNOSIS — E785 Hyperlipidemia, unspecified: Secondary | ICD-10-CM | POA: Insufficient documentation

## 2021-05-16 DIAGNOSIS — Z9359 Other cystostomy status: Secondary | ICD-10-CM | POA: Diagnosis not present

## 2021-05-16 DIAGNOSIS — I25810 Atherosclerosis of coronary artery bypass graft(s) without angina pectoris: Secondary | ICD-10-CM | POA: Diagnosis not present

## 2021-05-16 DIAGNOSIS — E119 Type 2 diabetes mellitus without complications: Secondary | ICD-10-CM | POA: Diagnosis not present

## 2021-05-16 DIAGNOSIS — I341 Nonrheumatic mitral (valve) prolapse: Secondary | ICD-10-CM | POA: Diagnosis not present

## 2021-05-16 DIAGNOSIS — Z4803 Encounter for change or removal of drains: Secondary | ICD-10-CM | POA: Diagnosis not present

## 2021-05-16 DIAGNOSIS — Z87448 Personal history of other diseases of urinary system: Secondary | ICD-10-CM | POA: Diagnosis not present

## 2021-05-16 DIAGNOSIS — I10 Essential (primary) hypertension: Secondary | ICD-10-CM | POA: Diagnosis not present

## 2021-05-16 DIAGNOSIS — Z7984 Long term (current) use of oral hypoglycemic drugs: Secondary | ICD-10-CM | POA: Diagnosis not present

## 2021-05-16 DIAGNOSIS — Z48812 Encounter for surgical aftercare following surgery on the circulatory system: Secondary | ICD-10-CM | POA: Diagnosis not present

## 2021-05-16 DIAGNOSIS — N323 Diverticulum of bladder: Secondary | ICD-10-CM | POA: Diagnosis not present

## 2021-05-16 DIAGNOSIS — Z720 Tobacco use: Secondary | ICD-10-CM | POA: Insufficient documentation

## 2021-05-17 ENCOUNTER — Ambulatory Visit (INDEPENDENT_AMBULATORY_CARE_PROVIDER_SITE_OTHER): Payer: Medicare Other | Admitting: Cardiology

## 2021-05-17 ENCOUNTER — Encounter: Payer: Self-pay | Admitting: Cardiology

## 2021-05-17 ENCOUNTER — Other Ambulatory Visit: Payer: Self-pay

## 2021-05-17 VITALS — BP 128/68 | HR 84 | Ht 72.0 in | Wt 147.0 lb

## 2021-05-17 DIAGNOSIS — Z9889 Other specified postprocedural states: Secondary | ICD-10-CM

## 2021-05-17 DIAGNOSIS — I2510 Atherosclerotic heart disease of native coronary artery without angina pectoris: Secondary | ICD-10-CM | POA: Diagnosis not present

## 2021-05-17 DIAGNOSIS — Z951 Presence of aortocoronary bypass graft: Secondary | ICD-10-CM | POA: Diagnosis not present

## 2021-05-17 NOTE — Progress Notes (Signed)
Cardiology Office Note:    Date:  05/17/2021   ID:  Joseph Robles, DOB 22-Sep-1950, MRN [id number]  PCP:  Patient, No Pcp Per (Inactive)  Cardiologist:  Garwin Brothers, MD   Referring MD: No ref. provider found    ASSESSMENT:    No diagnosis found. PLAN:    In order of problems listed above:  Coronary artery disease: Secondary prevention stressed with the patient.  Importance of compliance with diet medication stressed and vocalized understanding.  He has begun walking on a regular basis.  He is grabbed her with essential healing well and is happy about it. Mitral valve regurgitation: Post mitral valve annuloplasty ring: Patient is taking aspirin on a daily basis as prescribed by surgery.  They have a follow-up appointment with our surgical colleagues. Cigarette smoking: He has quit smoking since the surgery and I congratulated him about this.  He promises not to go back. Anemia: Unclear etiology.  Followed by primary care.  I will do a Chem-7 and CBC today to follow-up on this. Patient will be seen in follow-up appointment in 4 weeks or earlier if the patient has any concerns    Medication Adjustments/Labs and Tests Ordered: Current medicines are reviewed at length with the patient today.  Concerns regarding medicines are outlined above.  No orders of the defined types were placed in this encounter.  No orders of the defined types were placed in this encounter.    No chief complaint on file.    History of Present Illness:    Joseph Robles is a 70 y.o. male.  Patient was evaluated by me at the hospital.  He was transferred to [facility] for coronary angiography which revealed multivessel disease and severe mitral regurgitation.  Patient underwent mitral valve ring annuloplasty with bypass surgery.  Subsequently is done well.  No chest pain orthopnea or PND.  Fortunately has quit smoking since that surgery.  At the time of my evaluation, the patient is alert awake  oriented and in no distress.  He has an appointment for follow-up with his surgeon.  In the interim he has been advised to take 325 mg of aspirin coated on a daily basis.  He denies any kidney bleeding or any such problems.  At the time of my evaluation, the patient is alert awake oriented and in no distress.  Past Medical History:  Diagnosis Date   Anterior urethral stricture 12/11/2019   Bladder diverticulum 01/09/2020   Coronary artery disease 05/03/2021   DM type 2 (diabetes mellitus, type 2) (HCC)    Hyperlipidemia    Incomplete emptying of bladder 01/09/2020   Lower urinary tract symptoms (LUTS) 10/20/2019   Nonrheumatic mitral valve regurgitation    Protein-calorie malnutrition, severe 05/02/2021   S/P CABG x 2 05/03/2021   SVG to OM SVG to PDA   S/P mitral valve repair 05/03/2021   Size 32 mm Annuloplasty ring   Severe mitral regurgitation 04/26/2021   Suprapubic catheter (HCC) 03/29/2020   Syncope and collapse    Tobacco abuse     Past Surgical History:  Procedure Laterality Date   CORONARY ARTERY BYPASS GRAFT N/A 05/03/2021   Procedure: CORONARY ARTERY BYPASS GRAFTING (CABG) TIMES TWO ON PUMP USING ENDOSCOPICALLY HARVESTED RIGHT GREATER SAPHENOUS VEIN;  Surgeon: Loreli Slot, MD;  Location: [facility] OR;  Service: Open Heart Surgery;  Laterality: N/A;   ENDOVEIN HARVEST OF GREATER SAPHENOUS VEIN Right 05/03/2021   Procedure: ENDOVEIN HARVEST OF GREATER SAPHENOUS VEIN;  Surgeon: Charlett Lango  C, MD;  Location: [facility] OR;  Service: Open Heart Surgery;  Laterality: Right;   MITRAL VALVE REPAIR N/A 05/03/2021   Procedure: MITRAL VALVE REPAIR USING MEDTRONIC SIMUFORM ANNULOPLASTY RING (MVR);  Surgeon: Loreli Slot, MD;  Location: [facility] OR;  Service: Open Heart Surgery;  Laterality: N/A;   RIGHT/LEFT HEART CATH AND CORONARY ANGIOGRAPHY N/A 04/26/2021   Procedure: RIGHT/LEFT HEART CATH AND CORONARY ANGIOGRAPHY;  Surgeon: Kathleene Hazel, MD;  Location: [facility] INVASIVE CV LAB;   Service: Cardiovascular;  Laterality: N/A;   TEE WITHOUT CARDIOVERSION N/A 04/28/2021   Procedure: TRANSESOPHAGEAL ECHOCARDIOGRAM (TEE);  Surgeon: Pricilla Riffle, MD;  Location: [facility] ENDOSCOPY;  Service: Cardiovascular;  Laterality: N/A;   TEE WITHOUT CARDIOVERSION N/A 05/03/2021   Procedure: TRANSESOPHAGEAL ECHOCARDIOGRAM (TEE);  Surgeon: Loreli Slot, MD;  Location: [facility] OR;  Service: Open Heart Surgery;  Laterality: N/A;   URETERAL STENT PLACEMENT      Current Medications: Current Meds  Medication Sig   ascorbic acid (VITAMIN C) 100 MG tablet Take 100 mg by mouth daily.   aspirin EC 325 MG EC tablet Take 1 tablet (325 mg total) by mouth daily.   atorvastatin (LIPITOR) 10 MG tablet Take 10 mg by mouth daily.   Cholecalciferol 25 MCG (1000 UT) tablet Take 1,000 Units by mouth daily.   cyanocobalamin 100 MCG tablet Take 100 mcg by mouth daily.   ferrous gluconate (FERGON) 324 MG tablet Take 1 tablet (324 mg total) by mouth 2 (two) times daily with a meal.   metFORMIN (GLUCOPHAGE) 1000 MG tablet Take 1,000 mg by mouth 2 (two) times daily.   metoprolol succinate (TOPROL-XL) 25 MG 24 hr tablet Take 1 tablet (25 mg total) by mouth daily.   oxybutynin (DITROPAN) 5 MG tablet Take 5 mg by mouth daily as needed for bladder spasms.   sitaGLIPtin (JANUVIA) 100 MG tablet Take 100 mg by mouth daily.     Allergies:   Neosporin [neomycin-bacitracin zn-polymyx]   Social History   Socioeconomic History   Marital status: Married    Spouse name: Not on file   Number of children: Not on file   Years of education: Not on file   Highest education level: Not on file  Occupational History   Not on file  Tobacco Use   Smoking status: Every Day    Packs/day: 0.50    Types: Cigarettes   Smokeless tobacco: Never  Substance and Sexual Activity   Alcohol use: Yes    Comment: social drinker   Drug use: No   Sexual activity: Not on file  Other Topics Concern   Not on file  Social History Narrative    Not on file   Social Determinants of Health   Financial Resource Strain: Not on file  Food Insecurity: Not on file  Transportation Needs: Not on file  Physical Activity: Not on file  Stress: Not on file  Social Connections: Not on file     Family History: The patient's family history includes Hyperlipidemia in his father. There is no history of Hypertension, Heart disease, Diabetes, or Cancer.  ROS:   Please see the history of present illness.    All other systems reviewed and are negative.  EKGs/Labs/Other Studies Reviewed:    The following studies were reviewed today: CORONARY ARTERY BYPASS GRAFTING (CABG) TIMES TWO ON PUMP USING ENDOSCOPICALLY HARVESTED RIGHT GREATER SAPHENOUS VEIN N/A General  MITRAL VALVE REPAIR USING MEDTRONIC SIMUFORM ANNULOPLASTY RING (MVR)       Recent  Labs: 05/03/2021: ALT 20 05/08/2021: BUN 27; Creatinine, Ser 0.81; Magnesium 1.7; Potassium 3.9; Sodium 134 05/09/2021: Hemoglobin 8.9; Platelets 198  Recent Lipid Panel    Component Value Date/Time   CHOL 143 04/27/2021 0224   TRIG 57 04/27/2021 0224   HDL 47 04/27/2021 0224   CHOLHDL 3.0 04/27/2021 0224   VLDL 11 04/27/2021 0224   LDLCALC 85 04/27/2021 0224    Physical Exam:    VS:  BP 128/68   Pulse 84   Ht 6' (1.829 m)   Wt 147 lb (66.7 kg)   SpO2 98%   BMI 19.94 kg/m     Wt Readings from Last 3 Encounters:  05/17/21 147 lb (66.7 kg)  05/09/21 160 lb 0.9 oz (72.6 kg)     GEN: Patient is in no acute distress HEENT: Normal NECK: No JVD; No carotid bruits LYMPHATICS: No lymphadenopathy CARDIAC: Hear sounds regular, 2/6 systolic murmur at the apex. RESPIRATORY:  Clear to auscultation without rales, wheezing or rhonchi  ABDOMEN: Soft, non-tender, non-distended MUSCULOSKELETAL:  No edema; No deformity  SKIN: Warm and dry NEUROLOGIC:  Alert and oriented x 3 PSYCHIATRIC:  Normal affect   Signed, Garwin Brothers, MD  05/17/2021 2:40 PM     [facility]

## 2021-05-17 NOTE — Patient Instructions (Signed)
Medication Instructions:  Your physician recommends that you continue on your current medications as directed. Please refer to the Current Medication list given to you today.  *If you need a refill on your cardiac medications before your next appointment, please call your pharmacy*   Lab Work: Your physician recommends that you have a BMET and LFT's done in the office.  If you have labs (blood work) drawn today and your tests are completely normal, you will receive your results only by: MyChart Message (if you have MyChart) OR A paper copy in the mail If you have any lab test that is abnormal or we need to change your treatment, we will call you to review the results.   Testing/Procedures: None ordered   Follow-Up: At [facility], you and your health needs are our priority.  As part of our continuing mission to provide you with exceptional heart care, we have created designated Provider Care Teams.  These Care Teams include your primary Cardiologist (physician) and Advanced Practice Providers (APPs -  Physician Assistants and Nurse Practitioners) who all work together to provide you with the care you need, when you need it.  We recommend signing up for the patient portal called "MyChart".  Sign up information is provided on this After Visit Summary.  MyChart is used to connect with patients for Virtual Visits (Telemedicine).  Patients are able to view lab/test results, encounter notes, upcoming appointments, etc.  Non-urgent messages can be sent to your provider as well.   To learn more about what you can do with MyChart, go to [email or web address].    Your next appointment:   3 month(s)  The format for your next appointment:   In Person  Provider:   Belva Crome, MD   Other Instructions NA

## 2021-05-18 DIAGNOSIS — I341 Nonrheumatic mitral (valve) prolapse: Secondary | ICD-10-CM | POA: Diagnosis not present

## 2021-05-18 DIAGNOSIS — E119 Type 2 diabetes mellitus without complications: Secondary | ICD-10-CM | POA: Diagnosis not present

## 2021-05-18 DIAGNOSIS — I10 Essential (primary) hypertension: Secondary | ICD-10-CM | POA: Diagnosis not present

## 2021-05-18 DIAGNOSIS — I25810 Atherosclerosis of coronary artery bypass graft(s) without angina pectoris: Secondary | ICD-10-CM | POA: Diagnosis not present

## 2021-05-18 DIAGNOSIS — Z48812 Encounter for surgical aftercare following surgery on the circulatory system: Secondary | ICD-10-CM | POA: Diagnosis not present

## 2021-05-18 DIAGNOSIS — Z7984 Long term (current) use of oral hypoglycemic drugs: Secondary | ICD-10-CM | POA: Diagnosis not present

## 2021-05-18 LAB — HEPATIC FUNCTION PANEL
ALT: 22 IU/L (ref 0–44)
AST: 16 IU/L (ref 0–40)
Albumin: 4.3 g/dL (ref 3.8–4.8)
Alkaline Phosphatase: 208 IU/L — ABNORMAL HIGH (ref 44–121)
Bilirubin Total: 0.7 mg/dL (ref 0.0–1.2)
Bilirubin, Direct: 0.21 mg/dL (ref 0.00–0.40)
Total Protein: 7.3 g/dL (ref 6.0–8.5)

## 2021-05-18 LAB — BASIC METABOLIC PANEL
BUN/Creatinine Ratio: 22 (ref 10–24)
BUN: 24 mg/dL (ref 8–27)
CO2: 19 mmol/L — ABNORMAL LOW (ref 20–29)
Calcium: 9.7 mg/dL (ref 8.6–10.2)
Chloride: 93 mmol/L — ABNORMAL LOW (ref 96–106)
Creatinine, Ser: 1.11 mg/dL (ref 0.76–1.27)
Glucose: 169 mg/dL — ABNORMAL HIGH (ref 70–99)
Potassium: 4.8 mmol/L (ref 3.5–5.2)
Sodium: 133 mmol/L — ABNORMAL LOW (ref 134–144)
eGFR: 72 mL/min/{1.73_m2} (ref >59)

## 2021-05-19 DIAGNOSIS — I10 Essential (primary) hypertension: Secondary | ICD-10-CM | POA: Diagnosis not present

## 2021-05-19 DIAGNOSIS — I341 Nonrheumatic mitral (valve) prolapse: Secondary | ICD-10-CM | POA: Diagnosis not present

## 2021-05-19 DIAGNOSIS — I25810 Atherosclerosis of coronary artery bypass graft(s) without angina pectoris: Secondary | ICD-10-CM | POA: Diagnosis not present

## 2021-05-19 DIAGNOSIS — E119 Type 2 diabetes mellitus without complications: Secondary | ICD-10-CM | POA: Diagnosis not present

## 2021-05-19 DIAGNOSIS — Z48812 Encounter for surgical aftercare following surgery on the circulatory system: Secondary | ICD-10-CM | POA: Diagnosis not present

## 2021-05-19 DIAGNOSIS — Z7984 Long term (current) use of oral hypoglycemic drugs: Secondary | ICD-10-CM | POA: Diagnosis not present

## 2021-05-25 DIAGNOSIS — Z4803 Encounter for change or removal of drains: Secondary | ICD-10-CM | POA: Diagnosis not present

## 2021-05-26 DIAGNOSIS — Z7984 Long term (current) use of oral hypoglycemic drugs: Secondary | ICD-10-CM | POA: Diagnosis not present

## 2021-05-26 DIAGNOSIS — I10 Essential (primary) hypertension: Secondary | ICD-10-CM | POA: Diagnosis not present

## 2021-05-26 DIAGNOSIS — I25810 Atherosclerosis of coronary artery bypass graft(s) without angina pectoris: Secondary | ICD-10-CM | POA: Diagnosis not present

## 2021-05-26 DIAGNOSIS — I341 Nonrheumatic mitral (valve) prolapse: Secondary | ICD-10-CM | POA: Diagnosis not present

## 2021-05-26 DIAGNOSIS — Z48812 Encounter for surgical aftercare following surgery on the circulatory system: Secondary | ICD-10-CM | POA: Diagnosis not present

## 2021-05-26 DIAGNOSIS — E119 Type 2 diabetes mellitus without complications: Secondary | ICD-10-CM | POA: Diagnosis not present

## 2021-05-29 ENCOUNTER — Other Ambulatory Visit: Payer: Self-pay | Admitting: Thoracic Surgery (Cardiothoracic Vascular Surgery)

## 2021-05-29 DIAGNOSIS — Z1331 Encounter for screening for depression: Secondary | ICD-10-CM | POA: Diagnosis not present

## 2021-05-29 DIAGNOSIS — E1129 Type 2 diabetes mellitus with other diabetic kidney complication: Secondary | ICD-10-CM | POA: Diagnosis not present

## 2021-05-29 DIAGNOSIS — Z139 Encounter for screening, unspecified: Secondary | ICD-10-CM | POA: Diagnosis not present

## 2021-05-29 DIAGNOSIS — R32 Unspecified urinary incontinence: Secondary | ICD-10-CM | POA: Diagnosis not present

## 2021-05-29 DIAGNOSIS — Z0000 Encounter for general adult medical examination without abnormal findings: Secondary | ICD-10-CM | POA: Diagnosis not present

## 2021-05-29 DIAGNOSIS — Z951 Presence of aortocoronary bypass graft: Secondary | ICD-10-CM

## 2021-05-29 DIAGNOSIS — Z136 Encounter for screening for cardiovascular disorders: Secondary | ICD-10-CM | POA: Diagnosis not present

## 2021-05-29 DIAGNOSIS — N9989 Other postprocedural complications and disorders of genitourinary system: Secondary | ICD-10-CM | POA: Diagnosis not present

## 2021-05-29 DIAGNOSIS — E785 Hyperlipidemia, unspecified: Secondary | ICD-10-CM | POA: Diagnosis not present

## 2021-05-30 ENCOUNTER — Ambulatory Visit
Admission: RE | Admit: 2021-05-30 | Discharge: 2021-05-30 | Disposition: A | Discharge status: Home / Self Care | Payer: Medicare Other | Source: Ambulatory Visit | Attending: Thoracic Surgery (Cardiothoracic Vascular Surgery) | Admitting: Thoracic Surgery (Cardiothoracic Vascular Surgery)

## 2021-05-30 ENCOUNTER — Ambulatory Visit (INDEPENDENT_AMBULATORY_CARE_PROVIDER_SITE_OTHER): Payer: Self-pay | Admitting: Physician Assistant

## 2021-05-30 ENCOUNTER — Other Ambulatory Visit: Payer: Self-pay

## 2021-05-30 VITALS — BP 115/71 | HR 100 | Resp 20 | Ht 72.0 in | Wt 148.0 lb

## 2021-05-30 DIAGNOSIS — R0602 Shortness of breath: Secondary | ICD-10-CM | POA: Diagnosis not present

## 2021-05-30 DIAGNOSIS — Z951 Presence of aortocoronary bypass graft: Secondary | ICD-10-CM
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
## 2021-06-05 ENCOUNTER — Telehealth: Payer: Self-pay | Admitting: *Deleted

## 2021-06-05 NOTE — Telephone Encounter (Signed)
Patient contacted the office with questions regarding his Mitral Valve implant. Patient states he received his Medtronic card in the mail and wanted to verify he didn't have a mechanical valve placed. Per his operative report, advised patient he has a Medtronic annuloplasty ring. Patient acknowledged receipt.

## 2021-06-06 DIAGNOSIS — Z9359 Other cystostomy status: Secondary | ICD-10-CM | POA: Diagnosis not present

## 2021-06-06 DIAGNOSIS — Z87448 Personal history of other diseases of urinary system: Secondary | ICD-10-CM | POA: Diagnosis not present

## 2021-06-08 DIAGNOSIS — Z4803 Encounter for change or removal of drains: Secondary | ICD-10-CM | POA: Diagnosis not present

## 2021-06-08 DIAGNOSIS — R338 Other retention of urine: Secondary | ICD-10-CM | POA: Diagnosis not present

## 2021-06-08 DIAGNOSIS — N323 Diverticulum of bladder: Secondary | ICD-10-CM | POA: Diagnosis not present

## 2021-06-08 DIAGNOSIS — N35814 Other anterior urethral stricture, male: Secondary | ICD-10-CM | POA: Diagnosis not present

## 2021-06-12 MED FILL — Heparin Sodium (Porcine) Inj 1000 Unit/ML: Qty: 1000 | Status: AC

## 2021-06-12 MED FILL — Magnesium Sulfate Inj 50%: INTRAMUSCULAR | Qty: 10 | Status: AC

## 2021-06-12 MED FILL — Potassium Chloride Inj 2 mEq/ML: INTRAVENOUS | Qty: 40 | Status: AC

## 2021-06-13 DIAGNOSIS — Z7984 Long term (current) use of oral hypoglycemic drugs: Secondary | ICD-10-CM | POA: Diagnosis not present

## 2021-06-13 DIAGNOSIS — I2510 Atherosclerotic heart disease of native coronary artery without angina pectoris: Secondary | ICD-10-CM | POA: Diagnosis not present

## 2021-06-13 DIAGNOSIS — Z7982 Long term (current) use of aspirin: Secondary | ICD-10-CM | POA: Diagnosis not present

## 2021-06-13 DIAGNOSIS — E119 Type 2 diabetes mellitus without complications: Secondary | ICD-10-CM | POA: Diagnosis not present

## 2021-06-13 DIAGNOSIS — E785 Hyperlipidemia, unspecified: Secondary | ICD-10-CM | POA: Diagnosis not present

## 2021-06-13 DIAGNOSIS — Z951 Presence of aortocoronary bypass graft: Secondary | ICD-10-CM | POA: Diagnosis not present

## 2021-06-14 DIAGNOSIS — I2510 Atherosclerotic heart disease of native coronary artery without angina pectoris: Secondary | ICD-10-CM | POA: Diagnosis not present

## 2021-06-14 DIAGNOSIS — E119 Type 2 diabetes mellitus without complications: Secondary | ICD-10-CM | POA: Diagnosis not present

## 2021-06-14 DIAGNOSIS — E785 Hyperlipidemia, unspecified: Secondary | ICD-10-CM | POA: Diagnosis not present

## 2021-06-14 DIAGNOSIS — Z951 Presence of aortocoronary bypass graft: Secondary | ICD-10-CM | POA: Diagnosis not present

## 2021-06-14 DIAGNOSIS — Z7984 Long term (current) use of oral hypoglycemic drugs: Secondary | ICD-10-CM | POA: Diagnosis not present

## 2021-06-14 DIAGNOSIS — Z7982 Long term (current) use of aspirin: Secondary | ICD-10-CM | POA: Diagnosis not present

## 2021-06-15 ENCOUNTER — Telehealth: Payer: Self-pay

## 2021-06-15 ENCOUNTER — Other Ambulatory Visit: Payer: Self-pay

## 2021-06-15 ENCOUNTER — Ambulatory Visit (INDEPENDENT_AMBULATORY_CARE_PROVIDER_SITE_OTHER): Payer: Medicare Other

## 2021-06-15 DIAGNOSIS — I050 Rheumatic mitral stenosis: Secondary | ICD-10-CM | POA: Diagnosis not present

## 2021-06-15 LAB — ECHOCARDIOGRAM COMPLETE
Calc EF: 32.4 %
MV M vel: 5.28 m/s
MV Peak grad: 111.5 mmHg
MV VTI: 1.58 cm2
Radius: 1 cm
S' Lateral: 4.7 cm
Single Plane A2C EF: 32.9 %
Single Plane A4C EF: 26.6 %

## 2021-06-15 NOTE — Telephone Encounter (Signed)
Pt's wife per DPR would like to know how much salt they need to add to their water for dehydration. How do you advise?

## 2021-06-16 DIAGNOSIS — Z7982 Long term (current) use of aspirin: Secondary | ICD-10-CM | POA: Diagnosis not present

## 2021-06-16 DIAGNOSIS — Z951 Presence of aortocoronary bypass graft: Secondary | ICD-10-CM | POA: Diagnosis not present

## 2021-06-16 DIAGNOSIS — I2510 Atherosclerotic heart disease of native coronary artery without angina pectoris: Secondary | ICD-10-CM | POA: Diagnosis not present

## 2021-06-16 DIAGNOSIS — Z7984 Long term (current) use of oral hypoglycemic drugs: Secondary | ICD-10-CM | POA: Diagnosis not present

## 2021-06-16 DIAGNOSIS — E785 Hyperlipidemia, unspecified: Secondary | ICD-10-CM | POA: Diagnosis not present

## 2021-06-16 DIAGNOSIS — E119 Type 2 diabetes mellitus without complications: Secondary | ICD-10-CM | POA: Diagnosis not present

## 2021-06-16 NOTE — Telephone Encounter (Signed)
Recommendations reviewed with pt as per Dr. Revankar's note.  Pt verbalized understanding and had no additional questions.   

## 2021-06-19 ENCOUNTER — Telehealth: Payer: Self-pay

## 2021-06-19 DIAGNOSIS — Z7984 Long term (current) use of oral hypoglycemic drugs: Secondary | ICD-10-CM | POA: Diagnosis not present

## 2021-06-19 DIAGNOSIS — Z7982 Long term (current) use of aspirin: Secondary | ICD-10-CM | POA: Diagnosis not present

## 2021-06-19 DIAGNOSIS — E785 Hyperlipidemia, unspecified: Secondary | ICD-10-CM | POA: Diagnosis not present

## 2021-06-19 DIAGNOSIS — I2510 Atherosclerotic heart disease of native coronary artery without angina pectoris: Secondary | ICD-10-CM | POA: Diagnosis not present

## 2021-06-19 DIAGNOSIS — Z951 Presence of aortocoronary bypass graft: Secondary | ICD-10-CM | POA: Diagnosis not present

## 2021-06-19 DIAGNOSIS — E119 Type 2 diabetes mellitus without complications: Secondary | ICD-10-CM | POA: Diagnosis not present

## 2021-06-19 NOTE — Telephone Encounter (Signed)
-----   Message from Garwin Brothers, MD sent at 06/16/2021  4:49 PM EST ----- We will discuss details at appointment.  Ejection fraction moderately depressed.  The valve regurgitation is moderate.  Cc  primary care/referring physician I am including a copy of this to the surgeon who performed the surgery also. For his information and follow up. Garwin Brothers, MD 06/16/2021 4:48 PM

## 2021-06-19 NOTE — Telephone Encounter (Signed)
BP 06/17/21 8:00 122/75  HR 91 2:00 113/71  HR 95 6:00 106/66  HR 95  06/18/21  8:00 134/83  HR 95 6:00 143/65  HR 91  How much salt should be added to the water and what type of salt.

## 2021-06-19 NOTE — Telephone Encounter (Signed)
Recommendations reviewed with pt as per Dr. Revankar's note.  Pt verbalized understanding and had no additional questions.   

## 2021-06-21 DIAGNOSIS — N211 Calculus in urethra: Secondary | ICD-10-CM | POA: Diagnosis not present

## 2021-06-21 DIAGNOSIS — N35914 Unspecified anterior urethral stricture, male: Secondary | ICD-10-CM | POA: Diagnosis not present

## 2021-06-21 DIAGNOSIS — Z9359 Other cystostomy status: Secondary | ICD-10-CM | POA: Diagnosis not present

## 2021-06-23 DIAGNOSIS — Z951 Presence of aortocoronary bypass graft: Secondary | ICD-10-CM | POA: Diagnosis not present

## 2021-06-23 DIAGNOSIS — Z7984 Long term (current) use of oral hypoglycemic drugs: Secondary | ICD-10-CM | POA: Diagnosis not present

## 2021-06-23 DIAGNOSIS — Z7982 Long term (current) use of aspirin: Secondary | ICD-10-CM | POA: Diagnosis not present

## 2021-06-23 DIAGNOSIS — I2510 Atherosclerotic heart disease of native coronary artery without angina pectoris: Secondary | ICD-10-CM | POA: Diagnosis not present

## 2021-06-23 DIAGNOSIS — E785 Hyperlipidemia, unspecified: Secondary | ICD-10-CM | POA: Diagnosis not present

## 2021-06-23 DIAGNOSIS — E119 Type 2 diabetes mellitus without complications: Secondary | ICD-10-CM | POA: Diagnosis not present

## 2021-06-26 DIAGNOSIS — Z7982 Long term (current) use of aspirin: Secondary | ICD-10-CM | POA: Diagnosis not present

## 2021-06-26 DIAGNOSIS — E119 Type 2 diabetes mellitus without complications: Secondary | ICD-10-CM | POA: Diagnosis not present

## 2021-06-26 DIAGNOSIS — I2510 Atherosclerotic heart disease of native coronary artery without angina pectoris: Secondary | ICD-10-CM | POA: Diagnosis not present

## 2021-06-26 DIAGNOSIS — E785 Hyperlipidemia, unspecified: Secondary | ICD-10-CM | POA: Diagnosis not present

## 2021-06-26 DIAGNOSIS — Z7984 Long term (current) use of oral hypoglycemic drugs: Secondary | ICD-10-CM | POA: Diagnosis not present

## 2021-06-26 DIAGNOSIS — Z951 Presence of aortocoronary bypass graft: Secondary | ICD-10-CM | POA: Diagnosis not present

## 2021-06-27 DIAGNOSIS — E785 Hyperlipidemia, unspecified: Secondary | ICD-10-CM | POA: Diagnosis not present

## 2021-06-27 DIAGNOSIS — Z7984 Long term (current) use of oral hypoglycemic drugs: Secondary | ICD-10-CM | POA: Diagnosis not present

## 2021-06-27 DIAGNOSIS — Z951 Presence of aortocoronary bypass graft: Secondary | ICD-10-CM | POA: Diagnosis not present

## 2021-06-27 DIAGNOSIS — E119 Type 2 diabetes mellitus without complications: Secondary | ICD-10-CM | POA: Diagnosis not present

## 2021-06-27 DIAGNOSIS — Z7982 Long term (current) use of aspirin: Secondary | ICD-10-CM | POA: Diagnosis not present

## 2021-06-27 DIAGNOSIS — I2510 Atherosclerotic heart disease of native coronary artery without angina pectoris: Secondary | ICD-10-CM | POA: Diagnosis not present

## 2021-06-28 DIAGNOSIS — R339 Retention of urine, unspecified: Secondary | ICD-10-CM | POA: Diagnosis not present

## 2021-06-28 DIAGNOSIS — Z466 Encounter for fitting and adjustment of urinary device: Secondary | ICD-10-CM | POA: Diagnosis not present

## 2021-07-03 DIAGNOSIS — Z7982 Long term (current) use of aspirin: Secondary | ICD-10-CM | POA: Diagnosis not present

## 2021-07-03 DIAGNOSIS — Z951 Presence of aortocoronary bypass graft: Secondary | ICD-10-CM | POA: Diagnosis not present

## 2021-07-03 DIAGNOSIS — E785 Hyperlipidemia, unspecified: Secondary | ICD-10-CM | POA: Diagnosis not present

## 2021-07-03 DIAGNOSIS — E119 Type 2 diabetes mellitus without complications: Secondary | ICD-10-CM | POA: Diagnosis not present

## 2021-07-03 DIAGNOSIS — Z7984 Long term (current) use of oral hypoglycemic drugs: Secondary | ICD-10-CM | POA: Diagnosis not present

## 2021-07-03 DIAGNOSIS — I2510 Atherosclerotic heart disease of native coronary artery without angina pectoris: Secondary | ICD-10-CM | POA: Diagnosis not present

## 2021-07-07 DIAGNOSIS — Z9889 Other specified postprocedural states: Secondary | ICD-10-CM | POA: Diagnosis not present

## 2021-07-07 DIAGNOSIS — Z955 Presence of coronary angioplasty implant and graft: Secondary | ICD-10-CM | POA: Diagnosis not present

## 2021-07-10 DIAGNOSIS — Z9889 Other specified postprocedural states: Secondary | ICD-10-CM | POA: Diagnosis not present

## 2021-07-10 DIAGNOSIS — Z955 Presence of coronary angioplasty implant and graft: Secondary | ICD-10-CM | POA: Diagnosis not present

## 2021-07-12 DIAGNOSIS — Z955 Presence of coronary angioplasty implant and graft: Secondary | ICD-10-CM | POA: Diagnosis not present

## 2021-07-12 DIAGNOSIS — Z9889 Other specified postprocedural states: Secondary | ICD-10-CM | POA: Diagnosis not present

## 2021-07-14 DIAGNOSIS — Z4803 Encounter for change or removal of drains: Secondary | ICD-10-CM | POA: Diagnosis not present

## 2021-07-17 DIAGNOSIS — Z955 Presence of coronary angioplasty implant and graft: Secondary | ICD-10-CM | POA: Diagnosis not present

## 2021-07-17 DIAGNOSIS — Z9889 Other specified postprocedural states: Secondary | ICD-10-CM | POA: Diagnosis not present

## 2021-07-19 DIAGNOSIS — Z955 Presence of coronary angioplasty implant and graft: Secondary | ICD-10-CM | POA: Diagnosis not present

## 2021-07-19 DIAGNOSIS — Z9889 Other specified postprocedural states: Secondary | ICD-10-CM | POA: Diagnosis not present

## 2021-07-21 DIAGNOSIS — Z955 Presence of coronary angioplasty implant and graft: Secondary | ICD-10-CM | POA: Diagnosis not present

## 2021-07-21 DIAGNOSIS — Z9889 Other specified postprocedural states: Secondary | ICD-10-CM | POA: Diagnosis not present

## 2021-07-24 DIAGNOSIS — Z9889 Other specified postprocedural states: Secondary | ICD-10-CM | POA: Diagnosis not present

## 2021-07-24 DIAGNOSIS — Z955 Presence of coronary angioplasty implant and graft: Secondary | ICD-10-CM | POA: Diagnosis not present

## 2021-07-26 DIAGNOSIS — Z466 Encounter for fitting and adjustment of urinary device: Secondary | ICD-10-CM | POA: Diagnosis not present

## 2021-07-26 DIAGNOSIS — Z9359 Other cystostomy status: Secondary | ICD-10-CM | POA: Diagnosis not present

## 2021-07-28 DIAGNOSIS — Z955 Presence of coronary angioplasty implant and graft: Secondary | ICD-10-CM | POA: Diagnosis not present

## 2021-07-28 DIAGNOSIS — Z9889 Other specified postprocedural states: Secondary | ICD-10-CM | POA: Diagnosis not present

## 2021-08-01 DIAGNOSIS — Z9889 Other specified postprocedural states: Secondary | ICD-10-CM | POA: Diagnosis not present

## 2021-08-01 DIAGNOSIS — Z955 Presence of coronary angioplasty implant and graft: Secondary | ICD-10-CM | POA: Diagnosis not present

## 2021-08-02 DIAGNOSIS — Z9889 Other specified postprocedural states: Secondary | ICD-10-CM | POA: Diagnosis not present

## 2021-08-02 DIAGNOSIS — Z955 Presence of coronary angioplasty implant and graft: Secondary | ICD-10-CM | POA: Diagnosis not present

## 2021-08-08 DIAGNOSIS — Z955 Presence of coronary angioplasty implant and graft: Secondary | ICD-10-CM | POA: Diagnosis not present

## 2021-08-08 DIAGNOSIS — Z954 Presence of other heart-valve replacement: Secondary | ICD-10-CM | POA: Diagnosis not present

## 2021-08-09 DIAGNOSIS — Z9359 Other cystostomy status: Secondary | ICD-10-CM | POA: Diagnosis not present

## 2021-08-09 DIAGNOSIS — Z466 Encounter for fitting and adjustment of urinary device: Secondary | ICD-10-CM | POA: Diagnosis not present

## 2021-08-14 DIAGNOSIS — Z954 Presence of other heart-valve replacement: Secondary | ICD-10-CM | POA: Diagnosis not present

## 2021-08-14 DIAGNOSIS — Z955 Presence of coronary angioplasty implant and graft: Secondary | ICD-10-CM | POA: Diagnosis not present

## 2021-08-16 DIAGNOSIS — Z954 Presence of other heart-valve replacement: Secondary | ICD-10-CM | POA: Diagnosis not present

## 2021-08-16 DIAGNOSIS — Z955 Presence of coronary angioplasty implant and graft: Secondary | ICD-10-CM | POA: Diagnosis not present

## 2021-08-18 DIAGNOSIS — Z955 Presence of coronary angioplasty implant and graft: Secondary | ICD-10-CM | POA: Diagnosis not present

## 2021-08-18 DIAGNOSIS — Z954 Presence of other heart-valve replacement: Secondary | ICD-10-CM | POA: Diagnosis not present

## 2021-08-21 DIAGNOSIS — E785 Hyperlipidemia, unspecified: Secondary | ICD-10-CM | POA: Diagnosis not present

## 2021-08-21 DIAGNOSIS — Z954 Presence of other heart-valve replacement: Secondary | ICD-10-CM | POA: Diagnosis not present

## 2021-08-21 DIAGNOSIS — Z955 Presence of coronary angioplasty implant and graft: Secondary | ICD-10-CM | POA: Diagnosis not present

## 2021-08-21 DIAGNOSIS — E1129 Type 2 diabetes mellitus with other diabetic kidney complication: Secondary | ICD-10-CM | POA: Diagnosis not present

## 2021-08-23 ENCOUNTER — Ambulatory Visit (INDEPENDENT_AMBULATORY_CARE_PROVIDER_SITE_OTHER): Payer: Medicare Other | Admitting: Cardiology

## 2021-08-23 ENCOUNTER — Encounter: Payer: Self-pay | Admitting: Cardiology

## 2021-08-23 ENCOUNTER — Other Ambulatory Visit: Payer: Self-pay

## 2021-08-23 VITALS — BP 128/72 | HR 90 | Ht 72.0 in | Wt 159.4 lb

## 2021-08-23 DIAGNOSIS — E1100 Type 2 diabetes mellitus with hyperosmolarity without nonketotic hyperglycemic-hyperosmolar coma (NKHHC): Secondary | ICD-10-CM

## 2021-08-23 DIAGNOSIS — Z951 Presence of aortocoronary bypass graft: Secondary | ICD-10-CM | POA: Diagnosis not present

## 2021-08-23 DIAGNOSIS — F17210 Nicotine dependence, cigarettes, uncomplicated: Secondary | ICD-10-CM

## 2021-08-23 DIAGNOSIS — I2510 Atherosclerotic heart disease of native coronary artery without angina pectoris: Secondary | ICD-10-CM | POA: Diagnosis not present

## 2021-08-23 DIAGNOSIS — Z954 Presence of other heart-valve replacement: Secondary | ICD-10-CM | POA: Diagnosis not present

## 2021-08-23 DIAGNOSIS — Z955 Presence of coronary angioplasty implant and graft: Secondary | ICD-10-CM | POA: Diagnosis not present

## 2021-08-23 DIAGNOSIS — Z9889 Other specified postprocedural states: Secondary | ICD-10-CM

## 2021-08-23 DIAGNOSIS — E782 Mixed hyperlipidemia: Secondary | ICD-10-CM

## 2021-08-23 HISTORY — DX: Nicotine dependence, cigarettes, uncomplicated: F17.210

## 2021-08-23 NOTE — Patient Instructions (Signed)

## 2021-08-23 NOTE — Progress Notes (Signed)
Cardiology Office Note:    Date:  08/23/2021   ID:  Joseph Robles, DOB May 02, 1951, MRN [id number]  PCP:  Patient, No Pcp Per (Inactive)  Cardiologist:  Garwin Brothers, MD   Referring MD: No ref. provider found    ASSESSMENT:    1. Coronary artery disease involving native coronary artery of native heart without angina pectoris   2. Mixed hyperlipidemia   3. S/P CABG x 2   4. S/P mitral valve repair   5. Type 2 diabetes mellitus with hyperosmolarity without coma, without long-term current use of insulin (HCC)   6. Continuous dependence on cigarette smoking    PLAN:    In order of problems listed above:  Coronary artery disease post CABG surgery: Secondary prevention stressed with the patient.  Importance of compliance with diet medication stressed and she vocalized understanding.  He was advised to walk at least half an hour a day 5 days a week and he promises to do so. Mitral valve angioplasty ring repair: Echocardiogram report was discussed and the report is markedly satisfactory.  Mitral regurgitation has resolved significantly. Mixed dyslipidemia: On lipid-lowering therapy.  Diet was emphasized.  Lipids followed by primary care. Cigarette smoking: I spent 5 minutes with the patient discussing solely about smoking. Smoking cessation was counseled. I suggested to the patient also different medications and pharmacological interventions. Patient is keen to try stopping on its own at this time. He will get back to me if he needs any further assistance in this matter. Anemia: On iron therapy followed by primary care. Patient is taking 325 mg of coated aspirin.  I told him to call our surgical colleagues and see if it would be fine to take 81 mg of a coated baby aspirin and he will do so and follow their instructions accordingly. Patient will be seen in follow-up appointment in 6 months or earlier if the patient has any concerns    Medication Adjustments/Labs and Tests Ordered: Current  medicines are reviewed at length with the patient today.  Concerns regarding medicines are outlined above.  No orders of the defined types were placed in this encounter.  No orders of the defined types were placed in this encounter.    No chief complaint on file.    History of Present Illness:    Joseph Robles is a 71 y.o. male.  Patient has past medical history of coronary artery disease post CABG surgery, mitral valve repair, mixed dyslipidemia, diabetes mellitus and smoking.  Unfortunately continues to smoke.  He denies any problems at this time and takes care of activities of daily living.  No chest pain orthopnea or PND.  At the time of my evaluation, the patient is alert awake oriented and in no distress.  Past Medical History:  Diagnosis Date   Anterior urethral stricture 12/11/2019   Bladder diverticulum 01/09/2020   Coronary artery disease 05/03/2021   DM type 2 (diabetes mellitus, type 2) (HCC)    Hyperlipidemia    Incomplete emptying of bladder 01/09/2020   Lower urinary tract symptoms (LUTS) 10/20/2019   Nonrheumatic mitral valve regurgitation    Protein-calorie malnutrition, severe 05/02/2021   S/P CABG x 2 05/03/2021   SVG to OM SVG to PDA   S/P mitral valve repair 05/03/2021   Size 32 mm Annuloplasty ring   Severe mitral regurgitation 04/26/2021   Suprapubic catheter (HCC) 03/29/2020   Syncope and collapse    Tobacco abuse     Past Surgical History:  Procedure Laterality Date  CORONARY ARTERY BYPASS GRAFT N/A 05/03/2021   Procedure: CORONARY ARTERY BYPASS GRAFTING (CABG) TIMES TWO ON PUMP USING ENDOSCOPICALLY HARVESTED RIGHT GREATER SAPHENOUS VEIN;  Surgeon: Loreli SlotHendrickson, Steven C, MD;  [facility];  Service: Open Heart Surgery;  Laterality: N/A;   ENDOVEIN HARVEST OF GREATER SAPHENOUS VEIN Right 05/03/2021   Procedure: ENDOVEIN HARVEST OF GREATER SAPHENOUS VEIN;  Surgeon: Loreli SlotHendrickson, Steven C, MD;  [facility];  Service: Open Heart Surgery;  Laterality: Right;    MITRAL VALVE REPAIR N/A 05/03/2021   Procedure: MITRAL VALVE REPAIR USING MEDTRONIC SIMUFORM 32MM ANNULOPLASTY RING (MVR);  Surgeon: Loreli SlotHendrickson, Steven C, MD;  [facility];  Service: Open Heart Surgery;  Laterality: N/A;   RIGHT/LEFT HEART CATH AND CORONARY ANGIOGRAPHY N/A 04/26/2021   Procedure: RIGHT/LEFT HEART CATH AND CORONARY ANGIOGRAPHY;  Surgeon: Kathleene HazelMcAlhany, Christopher D, MD;  [facility] INVAS[facility] LAB;  Service: Cardiovascular;  Laterality: N/A;   TEE WITHOUT CARDIOVERSION N/A 04/28/2021   Procedure: TRANSESOPHAGEAL ECHOCARDIOGRAM (TEE);  Surgeon: Pricilla Riffleoss, Paula V, MD;  [facility];  Service: Cardiovascular;  Laterality: N/A;   TEE WITHOUT CARDIOVERSION N/A 05/03/2021   Procedure: TRANSESOPHAGEAL ECHOCARDIOGRAM (TEE);  Surgeon: Loreli SlotHendrickson, Steven C, MD;  [facility];  Service: [facility] Surgery;  Laterality: N/A;   URETERAL STENT PLACEMENT      Current Medications: Current Meds  Medication Sig   ascorbic acid (VITAMIN C) 100 MG tablet Take 100 mg by mouth daily.   aspirin EC 325 MG EC tablet Take 1 tablet (325 mg total) by mouth daily.   atorvastatin (LIPITOR) 10 MG tablet Take 10 mg by mouth daily.   Cholecalciferol 25 MCG (1000 UT) tablet Take 1,000 Units by mouth daily.   cyanocobalamin 100 MCG tablet Take 100 mcg by mouth daily.   ferrous gluconate (FERGON) 324 MG tablet Take 1 tablet (324 mg total) by mouth 2 (two) times daily with a meal.   metFORMIN (GLUCOPHAGE) 1000 MG tablet Take 1,000 mg by mouth 2 (two) times daily.   metoprolol succinate (TOPROL-XL) 25 MG 24 hr tablet Take 1 tablet (25 mg total) by mouth daily.   oxybutynin (DITROPAN) 5 MG tablet Take 5 mg by mouth daily as needed for bladder spasms.   sitaGLIPtin (JANUVIA) 100 MG tablet Take 100 mg by mouth daily.     Allergies:   Neosporin [neomycin-bacitracin zn-polymyx]   Social History   Socioeconomic History   Marital status: Married    Spouse name: Not on file   Number of children: Not on  file   Years of education: Not on file   Highest education level: Not on file  Occupational History   Not on file  Tobacco Use   Smoking status: Every Day    Packs/day: 0.50    Types: Cigarettes   Smokeless tobacco: Never  Substance and Sexual Activity   Alcohol use: Yes    Comment: social drinker   Drug use: No   Sexual activity: Not on file  Other Topics Concern   Not on file  Social History Narrative   Not on file   Social Determinants of Health   Financial Resource Strain: Not on file  Food Insecurity: Not on file  Transportation Needs: Not on file  Physical Activity: Not on file  Stress: Not on file  Social Connections: Not on file     Family History: The patient's family history includes Hyperlipidemia in his father. There is no history of Hypertension, Heart disease, Diabetes, or Cancer.  ROS:  Please see the history of present illness.    All other systems reviewed and are negative.  EKGs/Labs/Other Studies Reviewed:    The following studies were reviewed today: IMPRESSIONS     1. Left ventricular ejection fraction, by estimation, is 35%%. The left  ventricle has normal function. The left ventricle has no regional wall  motion abnormalities. The left ventricular internal cavity size was mildly  dilated. Left ventricular diastolic   parameters are indeterminate.   2. Right ventricular systolic function is moderately reduced. The right  ventricular size is normal. Tricuspid regurgitation signal is inadequate  for assessing PA pressure.   3. Left atrial size was mildly dilated.   4. Mean gradient 6 mm Hg at 92 BPM. The mitral valve is degenerative.  Moderate mitral valve regurgitation. Moderate mitral stenosis. Moderate  mitral annular calcification.   5. The aortic valve is tricuspid. Aortic valve regurgitation is not  visualized. Mild aortic valve sclerosis is present, with no evidence of  aortic valve stenosis.   6. Aortic Normal DTA.   7. The  inferior vena cava is normal in size with greater than 50%  respiratory variability, suggesting right atrial pressure of 3 mmHg.    Recent Labs: 05/08/2021: Magnesium 1.7 05/09/2021: Hemoglobin 8.9; Platelets 198 05/17/2021: ALT 22; BUN 24; Creatinine, Ser 1.11; Potassium 4.8; Sodium 133  Recent Lipid Panel    Component Value Date/Time   CHOL 143 04/27/2021 0224   TRIG 57 04/27/2021 0224   HDL 47 04/27/2021 0224   CHOLHDL 3.0 04/27/2021 0224   VLDL 11 04/27/2021 0224   LDLCALC 85 04/27/2021 0224    Physical Exam:    VS:  BP 128/72    Pulse 90    Ht 6' (1.829 m)    Wt 159 lb 6.4 oz (72.3 kg)    SpO2 95%    BMI 21.62 kg/m     Wt Readings from Last 3 Encounters:  08/23/21 159 lb 6.4 oz (72.3 kg)  05/30/21 148 lb (67.1 kg)  05/17/21 147 lb (66.7 kg)     GEN: Patient is in no acute distress HEENT: Normal NECK: No JVD; No carotid bruits LYMPHATICS: No lymphadenopathy CARDIAC: Hear sounds regular, 2/6 systolic murmur at the apex. RESPIRATORY:  Clear to auscultation without rales, wheezing or rhonchi  ABDOMEN: Soft, non-tender, non-distended MUSCULOSKELETAL:  No edema; No deformity  SKIN: Warm and dry NEUROLOGIC:  Alert and oriented x 3 PSYCHIATRIC:  Normal affect   Signed, Garwin Brothers, MD  08/23/2021 3:40 PM    [facility]

## 2021-08-25 DIAGNOSIS — Z9359 Other cystostomy status: Secondary | ICD-10-CM | POA: Diagnosis not present

## 2021-08-25 DIAGNOSIS — Z466 Encounter for fitting and adjustment of urinary device: Secondary | ICD-10-CM | POA: Diagnosis not present

## 2021-08-28 DIAGNOSIS — Z954 Presence of other heart-valve replacement: Secondary | ICD-10-CM | POA: Diagnosis not present

## 2021-08-28 DIAGNOSIS — Z955 Presence of coronary angioplasty implant and graft: Secondary | ICD-10-CM | POA: Diagnosis not present

## 2021-08-29 DIAGNOSIS — Z9359 Other cystostomy status: Secondary | ICD-10-CM | POA: Diagnosis not present

## 2021-08-29 DIAGNOSIS — E1129 Type 2 diabetes mellitus with other diabetic kidney complication: Secondary | ICD-10-CM | POA: Diagnosis not present

## 2021-08-29 DIAGNOSIS — L400 Psoriasis vulgaris: Secondary | ICD-10-CM | POA: Diagnosis not present

## 2021-08-29 DIAGNOSIS — Z135 Encounter for screening for eye and ear disorders: Secondary | ICD-10-CM | POA: Diagnosis not present

## 2021-08-29 DIAGNOSIS — E785 Hyperlipidemia, unspecified: Secondary | ICD-10-CM | POA: Diagnosis not present

## 2021-08-30 DIAGNOSIS — Z954 Presence of other heart-valve replacement: Secondary | ICD-10-CM | POA: Diagnosis not present

## 2021-08-30 DIAGNOSIS — Z955 Presence of coronary angioplasty implant and graft: Secondary | ICD-10-CM | POA: Diagnosis not present

## 2021-09-01 DIAGNOSIS — Z955 Presence of coronary angioplasty implant and graft: Secondary | ICD-10-CM | POA: Diagnosis not present

## 2021-09-01 DIAGNOSIS — Z954 Presence of other heart-valve replacement: Secondary | ICD-10-CM | POA: Diagnosis not present

## 2021-09-04 ENCOUNTER — Other Ambulatory Visit: Payer: Self-pay | Admitting: Physician Assistant

## 2021-09-04 ENCOUNTER — Other Ambulatory Visit: Payer: Self-pay | Admitting: Cardiology

## 2021-09-04 DIAGNOSIS — Z955 Presence of coronary angioplasty implant and graft: Secondary | ICD-10-CM | POA: Diagnosis not present

## 2021-09-04 DIAGNOSIS — Z954 Presence of other heart-valve replacement: Secondary | ICD-10-CM | POA: Diagnosis not present

## 2021-09-06 DIAGNOSIS — R339 Retention of urine, unspecified: Secondary | ICD-10-CM | POA: Diagnosis not present

## 2021-09-06 DIAGNOSIS — Z466 Encounter for fitting and adjustment of urinary device: Secondary | ICD-10-CM | POA: Diagnosis not present

## 2021-09-08 DIAGNOSIS — Z954 Presence of other heart-valve replacement: Secondary | ICD-10-CM | POA: Diagnosis not present

## 2021-09-08 DIAGNOSIS — Z955 Presence of coronary angioplasty implant and graft: Secondary | ICD-10-CM | POA: Diagnosis not present

## 2021-09-11 DIAGNOSIS — Z954 Presence of other heart-valve replacement: Secondary | ICD-10-CM | POA: Diagnosis not present

## 2021-09-11 DIAGNOSIS — Z955 Presence of coronary angioplasty implant and graft: Secondary | ICD-10-CM | POA: Diagnosis not present

## 2021-09-14 DIAGNOSIS — Z9359 Other cystostomy status: Secondary | ICD-10-CM | POA: Diagnosis not present

## 2021-09-15 DIAGNOSIS — Z954 Presence of other heart-valve replacement: Secondary | ICD-10-CM | POA: Diagnosis not present

## 2021-09-15 DIAGNOSIS — Z955 Presence of coronary angioplasty implant and graft: Secondary | ICD-10-CM | POA: Diagnosis not present

## 2021-09-18 DIAGNOSIS — Z954 Presence of other heart-valve replacement: Secondary | ICD-10-CM | POA: Diagnosis not present

## 2021-09-18 DIAGNOSIS — Z955 Presence of coronary angioplasty implant and graft: Secondary | ICD-10-CM | POA: Diagnosis not present

## 2021-09-20 DIAGNOSIS — Z9359 Other cystostomy status: Secondary | ICD-10-CM | POA: Diagnosis not present

## 2021-09-20 DIAGNOSIS — Z466 Encounter for fitting and adjustment of urinary device: Secondary | ICD-10-CM | POA: Diagnosis not present

## 2021-09-20 DIAGNOSIS — Z954 Presence of other heart-valve replacement: Secondary | ICD-10-CM | POA: Diagnosis not present

## 2021-09-20 DIAGNOSIS — Z955 Presence of coronary angioplasty implant and graft: Secondary | ICD-10-CM | POA: Diagnosis not present

## 2021-09-22 DIAGNOSIS — Z954 Presence of other heart-valve replacement: Secondary | ICD-10-CM | POA: Diagnosis not present

## 2021-09-22 DIAGNOSIS — Z955 Presence of coronary angioplasty implant and graft: Secondary | ICD-10-CM | POA: Diagnosis not present

## 2021-09-25 DIAGNOSIS — Z954 Presence of other heart-valve replacement: Secondary | ICD-10-CM | POA: Diagnosis not present

## 2021-09-25 DIAGNOSIS — Z955 Presence of coronary angioplasty implant and graft: Secondary | ICD-10-CM | POA: Diagnosis not present

## 2021-09-27 DIAGNOSIS — Z4803 Encounter for change or removal of drains: Secondary | ICD-10-CM | POA: Diagnosis not present

## 2021-10-04 DIAGNOSIS — Z9359 Other cystostomy status: Secondary | ICD-10-CM | POA: Diagnosis not present

## 2021-10-04 DIAGNOSIS — Z466 Encounter for fitting and adjustment of urinary device: Secondary | ICD-10-CM | POA: Diagnosis not present

## 2021-10-13 DIAGNOSIS — Z466 Encounter for fitting and adjustment of urinary device: Secondary | ICD-10-CM | POA: Diagnosis not present

## 2021-10-13 DIAGNOSIS — R338 Other retention of urine: Secondary | ICD-10-CM | POA: Diagnosis not present

## 2021-10-20 DIAGNOSIS — Z9359 Other cystostomy status: Secondary | ICD-10-CM | POA: Diagnosis not present

## 2021-10-20 DIAGNOSIS — Z466 Encounter for fitting and adjustment of urinary device: Secondary | ICD-10-CM | POA: Diagnosis not present

## 2021-10-27 DIAGNOSIS — Z9359 Other cystostomy status: Secondary | ICD-10-CM | POA: Diagnosis not present

## 2021-10-27 DIAGNOSIS — Z466 Encounter for fitting and adjustment of urinary device: Secondary | ICD-10-CM | POA: Diagnosis not present

## 2021-11-03 DIAGNOSIS — Z4803 Encounter for change or removal of drains: Secondary | ICD-10-CM | POA: Diagnosis not present

## 2021-11-03 DIAGNOSIS — R338 Other retention of urine: Secondary | ICD-10-CM | POA: Diagnosis not present

## 2021-11-07 ENCOUNTER — Other Ambulatory Visit: Payer: Self-pay | Admitting: *Deleted

## 2021-11-07 NOTE — Patient Outreach (Signed)
[facility] [employer] service manager [facility]) Care Management ? ?11/07/2021 ? ?[name] ?1951/03/31 ?[id number] ? ? ?Received referral for Care Management from Insurance plan. Assigned patient to Kemper Durie, RN Care Coordinator for follow up. ? ? ?Vanice Sarah ?[facility] Care Management Assistant ?[phone] ? ?

## 2021-11-07 NOTE — Patient Outreach (Signed)
[facility] Customer service manager [facility]) Care Management ? ?11/07/2021 ? ?[name] ?11-07-1950 ?[id number] ? ? ? ?Referral Date: 4/4 ?Referral Source: Insurance ?Referral Reason: High Risk Patient ?Insurance: [employer] Reach ? ? ?Outreach attempt #1, unsuccessful, HIPAA compliant voice message left. ? ?Plan: ?RN CM will send outreach letter and follow up within the next 3-4 business days. ? ?Kemper Durie, RN, MSN, CCM ?[facility] Care Management  ?Community Care Manager ?[phone] ? ?

## 2021-11-10 DIAGNOSIS — Z466 Encounter for fitting and adjustment of urinary device: Secondary | ICD-10-CM | POA: Diagnosis not present

## 2021-11-10 DIAGNOSIS — Z9359 Other cystostomy status: Secondary | ICD-10-CM | POA: Diagnosis not present

## 2021-11-13 ENCOUNTER — Other Ambulatory Visit: Payer: Self-pay | Admitting: *Deleted

## 2021-11-13 DIAGNOSIS — N35013 Post-traumatic anterior urethral stricture: Secondary | ICD-10-CM | POA: Diagnosis not present

## 2021-11-13 DIAGNOSIS — N210 Calculus in bladder: Secondary | ICD-10-CM | POA: Diagnosis not present

## 2021-11-13 DIAGNOSIS — R338 Other retention of urine: Secondary | ICD-10-CM | POA: Diagnosis not present

## 2021-11-13 DIAGNOSIS — Z9359 Other cystostomy status: Secondary | ICD-10-CM | POA: Diagnosis not present

## 2021-11-13 NOTE — Patient Outreach (Signed)
[facility] Customer service manager [facility]) Care Management ? ?11/13/2021 ? ?Joseph Robles ?[birth year] ?[id number] ? ? ?Referral Date: 4/4 ?Referral Source: Insurance ?Referral Reason: High Risk Patient ?Insurance: [employer]CO Reach ?  ? ? ?Outreach attempt #2, successful.  Identity verified.  This care manager introduced self and stated purpose of call.  [facility] care management services explained.   ? ?Member report he is doing well and declines need for care management services.  Benefits of program explained, requested member to review brochure and reconsider.  He agrees to consider and for follow up. ? ?Plan: ?RN CM will send outreach letter and follow up within the next 2 weeks. ? ?Kemper Durie, RN, MSN, CCM ?[facility] Care Management  ?Community Care Manager ?[phone] ? ?

## 2021-11-14 DIAGNOSIS — E785 Hyperlipidemia, unspecified: Secondary | ICD-10-CM | POA: Diagnosis not present

## 2021-11-14 DIAGNOSIS — E1129 Type 2 diabetes mellitus with other diabetic kidney complication: Secondary | ICD-10-CM | POA: Diagnosis not present

## 2021-11-20 DIAGNOSIS — R338 Other retention of urine: Secondary | ICD-10-CM | POA: Diagnosis not present

## 2021-11-20 DIAGNOSIS — N210 Calculus in bladder: Secondary | ICD-10-CM | POA: Diagnosis not present

## 2021-11-20 DIAGNOSIS — Z466 Encounter for fitting and adjustment of urinary device: Secondary | ICD-10-CM | POA: Diagnosis not present

## 2021-11-22 DIAGNOSIS — E785 Hyperlipidemia, unspecified: Secondary | ICD-10-CM | POA: Diagnosis not present

## 2021-11-22 DIAGNOSIS — Z6820 Body mass index (BMI) 20.0-20.9, adult: Secondary | ICD-10-CM | POA: Diagnosis not present

## 2021-11-22 DIAGNOSIS — E1129 Type 2 diabetes mellitus with other diabetic kidney complication: Secondary | ICD-10-CM | POA: Diagnosis not present

## 2021-11-27 ENCOUNTER — Other Ambulatory Visit: Payer: Self-pay | Admitting: *Deleted

## 2021-11-27 NOTE — Patient Outreach (Signed)
[facility]) Care Management ? ?11/27/2021 ? ?Barney Drain ?08/16/1950 ?[id number] ? ? ?Call placed to member to follow up on decision for [facility] involvement.  Confirms he received information in the mail, declines offer for [facility] care management.  Will close case at this time.  ? ?Valente David, RN, MSN, CCM ?[facility] Care Management  ?Community Care Manager ?[phone] ? ?

## 2021-12-01 DIAGNOSIS — N210 Calculus in bladder: Secondary | ICD-10-CM | POA: Diagnosis not present

## 2021-12-01 DIAGNOSIS — Z466 Encounter for fitting and adjustment of urinary device: Secondary | ICD-10-CM | POA: Diagnosis not present

## 2021-12-01 DIAGNOSIS — R338 Other retention of urine: Secondary | ICD-10-CM | POA: Diagnosis not present

## 2021-12-01 DIAGNOSIS — R339 Retention of urine, unspecified: Secondary | ICD-10-CM | POA: Diagnosis not present

## 2021-12-01 DIAGNOSIS — Z01812 Encounter for preprocedural laboratory examination: Secondary | ICD-10-CM | POA: Diagnosis not present

## 2021-12-11 DIAGNOSIS — R339 Retention of urine, unspecified: Secondary | ICD-10-CM | POA: Diagnosis not present

## 2021-12-11 DIAGNOSIS — Z466 Encounter for fitting and adjustment of urinary device: Secondary | ICD-10-CM | POA: Diagnosis not present

## 2021-12-19 DIAGNOSIS — N210 Calculus in bladder: Secondary | ICD-10-CM | POA: Diagnosis not present

## 2021-12-19 DIAGNOSIS — E119 Type 2 diabetes mellitus without complications: Secondary | ICD-10-CM | POA: Diagnosis not present

## 2021-12-19 DIAGNOSIS — Z7984 Long term (current) use of oral hypoglycemic drugs: Secondary | ICD-10-CM | POA: Diagnosis not present

## 2021-12-19 DIAGNOSIS — Z87891 Personal history of nicotine dependence: Secondary | ICD-10-CM | POA: Diagnosis not present

## 2022-01-03 DIAGNOSIS — Z466 Encounter for fitting and adjustment of urinary device: Secondary | ICD-10-CM | POA: Diagnosis not present

## 2022-01-03 DIAGNOSIS — N35013 Post-traumatic anterior urethral stricture: Secondary | ICD-10-CM | POA: Diagnosis not present

## 2022-01-17 DIAGNOSIS — Z466 Encounter for fitting and adjustment of urinary device: Secondary | ICD-10-CM | POA: Diagnosis not present

## 2022-01-17 DIAGNOSIS — R339 Retention of urine, unspecified: Secondary | ICD-10-CM | POA: Diagnosis not present

## 2022-01-29 DIAGNOSIS — Z466 Encounter for fitting and adjustment of urinary device: Secondary | ICD-10-CM | POA: Diagnosis not present

## 2022-01-29 DIAGNOSIS — Z9359 Other cystostomy status: Secondary | ICD-10-CM | POA: Diagnosis not present

## 2022-02-03 DIAGNOSIS — L02511 Cutaneous abscess of right hand: Secondary | ICD-10-CM | POA: Diagnosis not present

## 2022-02-03 DIAGNOSIS — S60452A Superficial foreign body of right middle finger, initial encounter: Secondary | ICD-10-CM | POA: Diagnosis not present

## 2022-02-07 DIAGNOSIS — Z9359 Other cystostomy status: Secondary | ICD-10-CM | POA: Diagnosis not present

## 2022-02-07 DIAGNOSIS — Z466 Encounter for fitting and adjustment of urinary device: Secondary | ICD-10-CM | POA: Diagnosis not present

## 2022-02-09 DIAGNOSIS — L02511 Cutaneous abscess of right hand: Secondary | ICD-10-CM | POA: Diagnosis not present

## 2022-02-19 DIAGNOSIS — Z9359 Other cystostomy status: Secondary | ICD-10-CM | POA: Diagnosis not present

## 2022-02-19 DIAGNOSIS — Z466 Encounter for fitting and adjustment of urinary device: Secondary | ICD-10-CM | POA: Diagnosis not present

## 2022-02-20 DIAGNOSIS — Z466 Encounter for fitting and adjustment of urinary device: Secondary | ICD-10-CM | POA: Diagnosis not present

## 2022-02-20 DIAGNOSIS — R339 Retention of urine, unspecified: Secondary | ICD-10-CM | POA: Diagnosis not present

## 2022-02-22 ENCOUNTER — Encounter: Payer: Self-pay | Admitting: Cardiology

## 2022-02-22 ENCOUNTER — Ambulatory Visit (INDEPENDENT_AMBULATORY_CARE_PROVIDER_SITE_OTHER): Payer: Medicare Other | Admitting: Cardiology

## 2022-02-22 VITALS — BP 132/78 | HR 96 | Ht 72.0 in | Wt 150.7 lb

## 2022-02-22 DIAGNOSIS — E782 Mixed hyperlipidemia: Secondary | ICD-10-CM | POA: Diagnosis not present

## 2022-02-22 DIAGNOSIS — F17210 Nicotine dependence, cigarettes, uncomplicated: Secondary | ICD-10-CM

## 2022-02-22 DIAGNOSIS — I2510 Atherosclerotic heart disease of native coronary artery without angina pectoris: Secondary | ICD-10-CM | POA: Diagnosis not present

## 2022-02-22 DIAGNOSIS — Z951 Presence of aortocoronary bypass graft: Secondary | ICD-10-CM | POA: Diagnosis not present

## 2022-02-22 DIAGNOSIS — Z9889 Other specified postprocedural states: Secondary | ICD-10-CM

## 2022-02-22 NOTE — Progress Notes (Signed)
Cardiology Office Note:    Date:  02/22/2022   ID:  Joseph Robles, DOB February 05, 1951, MRN [id number]  PCP:  Charlott Rakes, MD  Cardiologist:  Garwin Brothers, MD   Referring MD: No ref. provider found    ASSESSMENT:    1. Coronary artery disease involving native coronary artery of native heart without angina pectoris   2. S/P CABG x 2   3. S/P mitral valve repair   4. Mixed hyperlipidemia   5. Continuous dependence on cigarette smoking    PLAN:    In order of problems listed above:  Coronary artery disease: Secondary prevention stressed with the patient.  Importance of compliance with diet and medication stressed and he vocalized understanding.  He was advised to walk at least half an hour a day 5 days a week and he promises to do so. Essential hypertension: Blood pressure stable and diet was emphasized.  Lifestyle modification urged. Mixed dyslipidemia: On lipid-lowering therapy followed by primary care.  Lipids were reviewed. Post mitral valve repair: Stable at this time. Cigarette smoker: I spent 5 minutes with the patient discussing solely about smoking. Smoking cessation was counseled. I suggested to the patient also different medications and pharmacological interventions. Patient is keen to try stopping on its own at this time. He will get back to me if he needs any further assistance in this matter. Patient will be seen in follow-up appointment in 6 months or earlier if the patient has any concerns    Medication Adjustments/Labs and Tests Ordered: Current medicines are reviewed at length with the patient today.  Concerns regarding medicines are outlined above.  No orders of the defined types were placed in this encounter.  No orders of the defined types were placed in this encounter.    No chief complaint on file.    History of Present Illness:    Joseph Robles is a 71 y.o. male.  Patient has past medical history of coronary artery disease post CABG surgery, mitral  valve repair, essential hypertension, dyslipidemia and cigarette smoking.  He denies any problems at this time and takes care of activities of daily living.  No chest pain orthopnea or PND.  At the time of my evaluation, the patient is alert awake oriented and in no distress.  Past Medical History:  Diagnosis Date   Anterior urethral stricture 12/11/2019   Bladder diverticulum 01/09/2020   Continuous dependence on cigarette smoking 08/23/2021   Coronary artery disease 05/03/2021   DM type 2 (diabetes mellitus, type 2) (HCC)    Hyperlipidemia    Incomplete emptying of bladder 01/09/2020   Lower urinary tract symptoms (LUTS) 10/20/2019   Nonrheumatic mitral valve regurgitation    Protein-calorie malnutrition, severe 05/02/2021   S/P CABG x 2 05/03/2021   SVG to OM SVG to PDA   S/P mitral valve repair 05/03/2021   Size 32 mm Annuloplasty ring   Severe mitral regurgitation 04/26/2021   Suprapubic catheter (HCC) 03/29/2020   Syncope and collapse     Past Surgical History:  Procedure Laterality Date   CORONARY ARTERY BYPASS GRAFT N/A 05/03/2021   Procedure: CORONARY ARTERY BYPASS GRAFTING (CABG) TIMES TWO ON PUMP USING ENDOSCOPICALLY HARVESTED RIGHT GREATER SAPHENOUS VEIN;  Surgeon: Loreli Slot, MD;  [facility] OR;  Service: Open Heart Surgery;  Laterality: N/A;   ENDOVEIN HARVEST OF GREATER SAPHENOUS VEIN Right 05/03/2021   Procedure: ENDOVEIN HARVEST OF GREATER SAPHENOUS VEIN;  Surgeon: Loreli Slot, MD;  Location: [facility] OR;  Service: Open  Heart Surgery;  Laterality: Right;   MITRAL VALVE REPAIR N/A 05/03/2021   Procedure: MITRAL VALVE REPAIR USING MEDTRONIC SIMUFORM ANNULOPLASTY RING (MVR);  Surgeon: Loreli Slot, MD;  Location: [facility] OR;  Service: Open Heart Surgery;  Laterality: N/A;   RIGHT/LEFT HEART CATH AND CORONARY ANGIOGRAPHY N/A 04/26/2021   Procedure: RIGHT/LEFT HEART CATH AND CORONARY ANGIOGRAPHY;  Surgeon: Kathleene Hazel, MD;  [facility]  INVASIVE CV LAB;  Service: Cardiovascular;  Laterality: N/A;   TEE WITHOUT CARDIOVERSION N/A 04/28/2021   Procedure: TRANSESOPHAGEAL ECHOCARDIOGRAM (TEE);  Surgeon: Pricilla Riffle, MD;  Location: [facility] ENDOSCOPY;  Service: Cardiovascular;  Laterality: N/A;   TEE WITHOUT CARDIOVERSION N/A 05/03/2021   Procedure: TRANSESOPHAGEAL ECHOCARDIOGRAM (TEE);  Surgeon: Loreli Slot, MD;  [facility];  Service: Open Heart Surgery;  Laterality: N/A;   URETERAL STENT PLACEMENT      Current Medications: Current Meds  Medication Sig   ascorbic acid (VITAMIN C) 100 MG tablet Take 100 mg by mouth daily.   aspirin EC 325 MG EC tablet Take 1 tablet (325 mg total) by mouth daily.   atorvastatin (LIPITOR) 10 MG tablet Take 10 mg by mouth daily.   Cholecalciferol 25 MCG (1000 UT) tablet Take 1,000 Units by mouth daily.   cyanocobalamin 100 MCG tablet Take 100 mcg by mouth daily.   ferrous gluconate (FERGON) 324 MG tablet Take 1 tablet (324 mg total) by mouth 2 (two) times daily with a meal.   metFORMIN (GLUCOPHAGE) 1000 MG tablet Take 1,000 mg by mouth 2 (two) times daily.   metoprolol succinate (TOPROL-XL) 25 MG 24 hr tablet Take 1 tablet (25 mg total) by mouth daily.   oxybutynin (DITROPAN) 5 MG tablet Take 5 mg by mouth daily as needed for bladder spasms.   sitaGLIPtin (JANUVIA) 100 MG tablet Take 100 mg by mouth daily.   trospium (SANCTURA) 20 MG tablet Take 20 mg by mouth 2 (two) times daily.     Allergies:   Neosporin [neomycin-bacitracin zn-polymyx]   Social History   Socioeconomic History   Marital status: Married    Spouse name: Not on file   Number of children: Not on file   Years of education: Not on file   Highest education level: Not on file  Occupational History   Not on file  Tobacco Use   Smoking status: Every Day    Packs/day: 0.50    Types: Cigarettes   Smokeless tobacco: Never  Substance and Sexual Activity   Alcohol use: Yes    Comment: social drinker   Drug use: No    Sexual activity: Not on file  Other Topics Concern   Not on file  Social History Narrative   Not on file   Social Determinants of Health   Financial Resource Strain: Not on file  Food Insecurity: Not on file  Transportation Needs: Not on file  Physical Activity: Not on file  Stress: Not on file  Social Connections: Not on file     Family History: The patient's family history includes Hyperlipidemia in his father. There is no history of Hypertension, Heart disease, Diabetes, or Cancer.  ROS:   Please see the history of present illness.    All other systems reviewed and are negative.  EKGs/Labs/Other Studies Reviewed:    The following studies were reviewed today: I discussed my findings with the patient at length   Recent Labs: 05/08/2021: Magnesium 1.7 05/09/2021: Hemoglobin 8.9; Platelets 198 05/17/2021: ALT 22; BUN 24; Creatinine, Ser 1.11;  Potassium 4.8; Sodium 133  Recent Lipid Panel    Component Value Date/Time   CHOL 143 04/27/2021 0224   TRIG 57 04/27/2021 0224   HDL 47 04/27/2021 0224   CHOLHDL 3.0 04/27/2021 0224   VLDL 11 04/27/2021 0224   LDLCALC 85 04/27/2021 0224    Physical Exam:    VS:  BP 132/78   Pulse 96   Ht 6' (1.829 m)   Wt 150 lb 11 oz (68.4 kg)   SpO2 96%   BMI 20.44 kg/m     Wt Readings from Last 3 Encounters:  02/22/22 150 lb 11 oz (68.4 kg)  08/23/21 159 lb 6.4 oz (72.3 kg)  05/30/21 148 lb (67.1 kg)     GEN: Patient is in no acute distress HEENT: Normal NECK: No JVD; No carotid bruits LYMPHATICS: No lymphadenopathy CARDIAC: Hear sounds regular, 2/6 systolic murmur at the apex. RESPIRATORY:  Clear to auscultation without rales, wheezing or rhonchi  ABDOMEN: Soft, non-tender, non-distended MUSCULOSKELETAL:  No edema; No deformity  SKIN: Warm and dry NEUROLOGIC:  Alert and oriented x 3 PSYCHIATRIC:  Normal affect   Signed, Garwin Brothers, MD  02/22/2022 1:24 PM    [facility]

## 2022-02-22 NOTE — Patient Instructions (Signed)

## 2022-02-28 DIAGNOSIS — S60452A Superficial foreign body of right middle finger, initial encounter: Secondary | ICD-10-CM | POA: Diagnosis not present

## 2022-03-09 DIAGNOSIS — R339 Retention of urine, unspecified: Secondary | ICD-10-CM | POA: Diagnosis not present

## 2022-03-09 DIAGNOSIS — Z466 Encounter for fitting and adjustment of urinary device: Secondary | ICD-10-CM | POA: Diagnosis not present

## 2022-03-21 DIAGNOSIS — R339 Retention of urine, unspecified: Secondary | ICD-10-CM | POA: Diagnosis not present

## 2022-03-21 DIAGNOSIS — Z466 Encounter for fitting and adjustment of urinary device: Secondary | ICD-10-CM | POA: Diagnosis not present

## 2022-03-26 DIAGNOSIS — E785 Hyperlipidemia, unspecified: Secondary | ICD-10-CM | POA: Diagnosis not present

## 2022-03-26 DIAGNOSIS — E1129 Type 2 diabetes mellitus with other diabetic kidney complication: Secondary | ICD-10-CM | POA: Diagnosis not present

## 2022-03-30 DIAGNOSIS — E1129 Type 2 diabetes mellitus with other diabetic kidney complication: Secondary | ICD-10-CM | POA: Diagnosis not present

## 2022-03-30 DIAGNOSIS — Z681 Body mass index (BMI) 19 or less, adult: Secondary | ICD-10-CM | POA: Diagnosis not present

## 2022-03-30 DIAGNOSIS — E785 Hyperlipidemia, unspecified: Secondary | ICD-10-CM | POA: Diagnosis not present

## 2022-04-03 DIAGNOSIS — Z466 Encounter for fitting and adjustment of urinary device: Secondary | ICD-10-CM | POA: Diagnosis not present

## 2022-04-03 DIAGNOSIS — R339 Retention of urine, unspecified: Secondary | ICD-10-CM | POA: Diagnosis not present

## 2022-04-16 DIAGNOSIS — R339 Retention of urine, unspecified: Secondary | ICD-10-CM | POA: Diagnosis not present

## 2022-04-16 DIAGNOSIS — Z466 Encounter for fitting and adjustment of urinary device: Secondary | ICD-10-CM | POA: Diagnosis not present

## 2022-05-02 DIAGNOSIS — R339 Retention of urine, unspecified: Secondary | ICD-10-CM | POA: Diagnosis not present

## 2022-05-02 DIAGNOSIS — Z9359 Other cystostomy status: Secondary | ICD-10-CM | POA: Diagnosis not present

## 2022-05-02 DIAGNOSIS — Z466 Encounter for fitting and adjustment of urinary device: Secondary | ICD-10-CM | POA: Diagnosis not present

## 2022-05-05 DIAGNOSIS — E1129 Type 2 diabetes mellitus with other diabetic kidney complication: Secondary | ICD-10-CM | POA: Diagnosis not present

## 2022-05-22 DIAGNOSIS — Z435 Encounter for attention to cystostomy: Secondary | ICD-10-CM | POA: Diagnosis not present

## 2022-05-22 DIAGNOSIS — R338 Other retention of urine: Secondary | ICD-10-CM | POA: Diagnosis not present

## 2022-05-24 DIAGNOSIS — Z9359 Other cystostomy status: Secondary | ICD-10-CM | POA: Diagnosis not present

## 2022-05-24 DIAGNOSIS — E1129 Type 2 diabetes mellitus with other diabetic kidney complication: Secondary | ICD-10-CM | POA: Diagnosis not present

## 2022-05-24 DIAGNOSIS — Z681 Body mass index (BMI) 19 or less, adult: Secondary | ICD-10-CM | POA: Diagnosis not present

## 2022-06-06 DIAGNOSIS — R339 Retention of urine, unspecified: Secondary | ICD-10-CM | POA: Diagnosis not present

## 2022-06-06 DIAGNOSIS — Z466 Encounter for fitting and adjustment of urinary device: Secondary | ICD-10-CM | POA: Diagnosis not present

## 2022-06-15 DIAGNOSIS — Z466 Encounter for fitting and adjustment of urinary device: Secondary | ICD-10-CM | POA: Diagnosis not present

## 2022-06-15 DIAGNOSIS — Z9359 Other cystostomy status: Secondary | ICD-10-CM | POA: Diagnosis not present

## 2022-06-15 DIAGNOSIS — R339 Retention of urine, unspecified: Secondary | ICD-10-CM | POA: Diagnosis not present

## 2022-06-19 DIAGNOSIS — Z960 Presence of urogenital implants: Secondary | ICD-10-CM | POA: Diagnosis not present

## 2022-06-19 DIAGNOSIS — N2889 Other specified disorders of kidney and ureter: Secondary | ICD-10-CM | POA: Diagnosis not present

## 2022-06-19 DIAGNOSIS — R339 Retention of urine, unspecified: Secondary | ICD-10-CM | POA: Diagnosis not present

## 2022-06-19 DIAGNOSIS — Z87448 Personal history of other diseases of urinary system: Secondary | ICD-10-CM | POA: Diagnosis not present

## 2022-06-19 DIAGNOSIS — N3289 Other specified disorders of bladder: Secondary | ICD-10-CM | POA: Diagnosis not present

## 2022-06-19 DIAGNOSIS — N210 Calculus in bladder: Secondary | ICD-10-CM | POA: Diagnosis not present

## 2022-06-19 DIAGNOSIS — Z9359 Other cystostomy status: Secondary | ICD-10-CM | POA: Diagnosis not present

## 2022-06-19 DIAGNOSIS — N139 Obstructive and reflux uropathy, unspecified: Secondary | ICD-10-CM | POA: Diagnosis not present

## 2022-06-25 DIAGNOSIS — R339 Retention of urine, unspecified: Secondary | ICD-10-CM | POA: Diagnosis not present

## 2022-06-25 DIAGNOSIS — Z466 Encounter for fitting and adjustment of urinary device: Secondary | ICD-10-CM | POA: Diagnosis not present

## 2022-07-06 DIAGNOSIS — R338 Other retention of urine: Secondary | ICD-10-CM | POA: Diagnosis not present

## 2022-07-06 DIAGNOSIS — Z466 Encounter for fitting and adjustment of urinary device: Secondary | ICD-10-CM | POA: Diagnosis not present

## 2022-07-16 DIAGNOSIS — R339 Retention of urine, unspecified: Secondary | ICD-10-CM | POA: Diagnosis not present

## 2022-07-20 DIAGNOSIS — Z125 Encounter for screening for malignant neoplasm of prostate: Secondary | ICD-10-CM | POA: Diagnosis not present

## 2022-07-20 DIAGNOSIS — E785 Hyperlipidemia, unspecified: Secondary | ICD-10-CM | POA: Diagnosis not present

## 2022-07-20 DIAGNOSIS — E1129 Type 2 diabetes mellitus with other diabetic kidney complication: Secondary | ICD-10-CM | POA: Diagnosis not present

## 2022-07-21 LAB — COMPREHENSIVE METABOLIC PANEL: eGFR: 80

## 2022-07-21 LAB — HEMOGLOBIN A1C: Hemoglobin-A1c: 7.4

## 2022-07-27 DIAGNOSIS — Z466 Encounter for fitting and adjustment of urinary device: Secondary | ICD-10-CM | POA: Diagnosis not present

## 2022-07-27 DIAGNOSIS — R339 Retention of urine, unspecified: Secondary | ICD-10-CM | POA: Diagnosis not present

## 2022-08-04 DIAGNOSIS — E1129 Type 2 diabetes mellitus with other diabetic kidney complication: Secondary | ICD-10-CM | POA: Diagnosis not present

## 2022-08-08 DIAGNOSIS — Z1331 Encounter for screening for depression: Secondary | ICD-10-CM | POA: Diagnosis not present

## 2022-08-08 DIAGNOSIS — Z139 Encounter for screening, unspecified: Secondary | ICD-10-CM | POA: Diagnosis not present

## 2022-08-08 DIAGNOSIS — Z466 Encounter for fitting and adjustment of urinary device: Secondary | ICD-10-CM | POA: Diagnosis not present

## 2022-08-08 DIAGNOSIS — Z720 Tobacco use: Secondary | ICD-10-CM | POA: Diagnosis not present

## 2022-08-08 DIAGNOSIS — Z23 Encounter for immunization: Secondary | ICD-10-CM | POA: Diagnosis not present

## 2022-08-08 DIAGNOSIS — Z9359 Other cystostomy status: Secondary | ICD-10-CM | POA: Diagnosis not present

## 2022-08-08 DIAGNOSIS — Z0000 Encounter for general adult medical examination without abnormal findings: Secondary | ICD-10-CM | POA: Diagnosis not present

## 2022-08-08 DIAGNOSIS — E785 Hyperlipidemia, unspecified: Secondary | ICD-10-CM | POA: Diagnosis not present

## 2022-08-08 DIAGNOSIS — E1129 Type 2 diabetes mellitus with other diabetic kidney complication: Secondary | ICD-10-CM | POA: Diagnosis not present

## 2022-08-08 DIAGNOSIS — Z1339 Encounter for screening examination for other mental health and behavioral disorders: Secondary | ICD-10-CM | POA: Diagnosis not present

## 2022-08-08 DIAGNOSIS — Z681 Body mass index (BMI) 19 or less, adult: Secondary | ICD-10-CM | POA: Diagnosis not present

## 2022-08-08 DIAGNOSIS — Z136 Encounter for screening for cardiovascular disorders: Secondary | ICD-10-CM | POA: Diagnosis not present

## 2022-08-17 DIAGNOSIS — Z466 Encounter for fitting and adjustment of urinary device: Secondary | ICD-10-CM | POA: Diagnosis not present

## 2022-08-17 DIAGNOSIS — Z9359 Other cystostomy status: Secondary | ICD-10-CM | POA: Diagnosis not present

## 2022-08-20 ENCOUNTER — Other Ambulatory Visit: Payer: Self-pay

## 2022-08-20 MED ORDER — METOPROLOL SUCCINATE ER 25 MG PO TB24: 25.0000 mg | ORAL_TABLET | Freq: Every day | ORAL | 1 refills | Status: DC
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
## 2022-08-27 DIAGNOSIS — Z466 Encounter for fitting and adjustment of urinary device: Secondary | ICD-10-CM | POA: Diagnosis not present

## 2022-08-27 DIAGNOSIS — Z9359 Other cystostomy status: Secondary | ICD-10-CM | POA: Diagnosis not present
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
## 2022-08-28 IMAGING — DX DG CHEST 1V PORT
1 series · 1 of 1 positions shown · non-contrast
Comparison: 05/04/2021

CLINICAL DATA: Chest tube removal.

EXAM:
PORTABLE CHEST 1 VIEW

[chest ap]
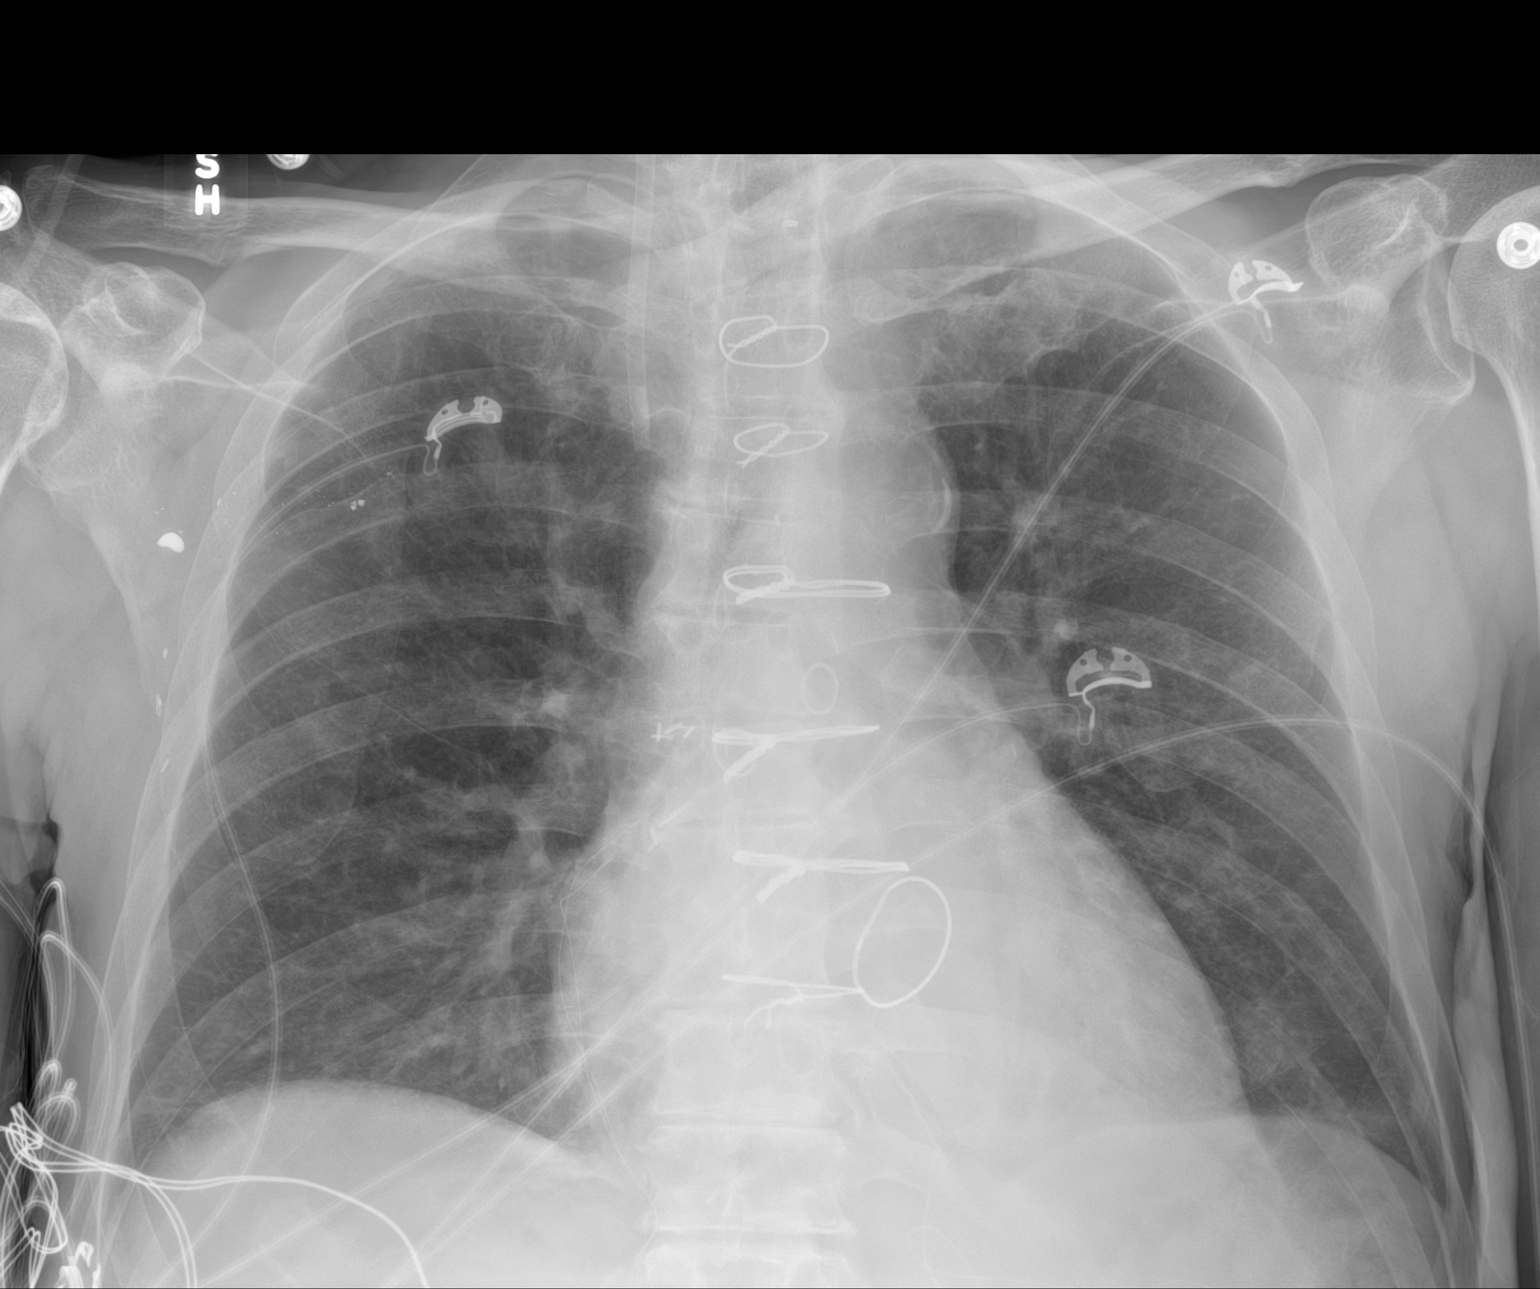

[1 of 1 positions shown; findings below may reference images not displayed]

FINDINGS: Interval removal of Swan-Ganz catheter and left chest tube. No
visible significant pneumothorax. Heart is normal size. No confluent
opacities or effusions.
IMPRESSION: No visible pneumothorax.  No acute findings.

## 2022-09-07 DIAGNOSIS — Z435 Encounter for attention to cystostomy: Secondary | ICD-10-CM | POA: Diagnosis not present

## 2022-09-07 DIAGNOSIS — R338 Other retention of urine: Secondary | ICD-10-CM | POA: Diagnosis not present

## 2022-09-20 DIAGNOSIS — Z435 Encounter for attention to cystostomy: Secondary | ICD-10-CM | POA: Diagnosis not present

## 2022-09-20 DIAGNOSIS — N210 Calculus in bladder: Secondary | ICD-10-CM | POA: Diagnosis not present

## 2022-09-20 DIAGNOSIS — R338 Other retention of urine: Secondary | ICD-10-CM | POA: Diagnosis not present

## 2022-09-20 DIAGNOSIS — N3289 Other specified disorders of bladder: Secondary | ICD-10-CM | POA: Diagnosis not present

## 2022-09-20 DIAGNOSIS — Z87448 Personal history of other diseases of urinary system: Secondary | ICD-10-CM | POA: Diagnosis not present
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
## 2022-10-04 DIAGNOSIS — N210 Calculus in bladder: Secondary | ICD-10-CM | POA: Diagnosis not present

## 2022-10-04 DIAGNOSIS — R339 Retention of urine, unspecified: Secondary | ICD-10-CM | POA: Diagnosis not present

## 2022-10-04 DIAGNOSIS — Z9359 Other cystostomy status: Secondary | ICD-10-CM | POA: Diagnosis not present

## 2022-10-04 DIAGNOSIS — Z466 Encounter for fitting and adjustment of urinary device: Secondary | ICD-10-CM | POA: Diagnosis not present

## 2022-10-24 DIAGNOSIS — R339 Retention of urine, unspecified: Secondary | ICD-10-CM

## 2022-10-24 DIAGNOSIS — N210 Calculus in bladder: Secondary | ICD-10-CM | POA: Diagnosis not present

## 2022-10-24 HISTORY — DX: Retention of urine, unspecified: R33.9

## 2022-10-25 ENCOUNTER — Telehealth: Payer: Self-pay | Admitting: Cardiology

## 2022-10-25 DIAGNOSIS — Z720 Tobacco use: Secondary | ICD-10-CM | POA: Diagnosis not present

## 2022-10-25 DIAGNOSIS — D509 Iron deficiency anemia, unspecified: Secondary | ICD-10-CM | POA: Diagnosis not present

## 2022-10-25 DIAGNOSIS — E119 Type 2 diabetes mellitus without complications: Secondary | ICD-10-CM | POA: Diagnosis not present

## 2022-10-25 DIAGNOSIS — I340 Nonrheumatic mitral (valve) insufficiency: Secondary | ICD-10-CM | POA: Diagnosis not present

## 2022-10-25 DIAGNOSIS — I2510 Atherosclerotic heart disease of native coronary artery without angina pectoris: Secondary | ICD-10-CM | POA: Diagnosis not present

## 2022-10-25 NOTE — Telephone Encounter (Signed)
Louellen Molder, PA called in from [facility]). She wants to know if Dr. Geraldo Pitter is okay with pt going under general anesthesia. She states she doesn't need medical or pharm clearance, she also stated that she didn't need a formal clearance. She states verbal or something faxed over will be fine. Please advise.   Phone number: [phone]  Fax number: [phone]

## 2022-10-26 ENCOUNTER — Other Ambulatory Visit: Payer: Self-pay

## 2022-10-26 NOTE — Telephone Encounter (Signed)
Scheduled at 12:15PM Monday due to the amount of overbooks on the schedule/kbl 10/26/22

## 2022-10-29 ENCOUNTER — Ambulatory Visit: Payer: Medicare Other | Attending: Cardiology | Admitting: Cardiology

## 2022-10-29 ENCOUNTER — Encounter: Payer: Self-pay | Admitting: Cardiology

## 2022-10-29 VITALS — BP 116/72 | HR 107 | Ht 72.0 in | Wt 142.4 lb

## 2022-10-29 DIAGNOSIS — Z951 Presence of aortocoronary bypass graft: Secondary | ICD-10-CM | POA: Insufficient documentation

## 2022-10-29 DIAGNOSIS — E782 Mixed hyperlipidemia: Secondary | ICD-10-CM | POA: Diagnosis not present

## 2022-10-29 DIAGNOSIS — F17210 Nicotine dependence, cigarettes, uncomplicated: Secondary | ICD-10-CM | POA: Insufficient documentation

## 2022-10-29 DIAGNOSIS — I2510 Atherosclerotic heart disease of native coronary artery without angina pectoris: Secondary | ICD-10-CM | POA: Diagnosis not present

## 2022-10-29 DIAGNOSIS — Z9889 Other specified postprocedural states: Secondary | ICD-10-CM | POA: Insufficient documentation

## 2022-10-29 DIAGNOSIS — Z01810 Encounter for preprocedural cardiovascular examination: Secondary | ICD-10-CM

## 2022-10-29 DIAGNOSIS — E1100 Type 2 diabetes mellitus with hyperosmolarity without nonketotic hyperglycemic-hyperosmolar coma (NKHHC): Secondary | ICD-10-CM | POA: Diagnosis not present

## 2022-10-29 HISTORY — DX: Encounter for preprocedural cardiovascular examination: Z01.810

## 2022-10-29 NOTE — Progress Notes (Addendum)
Cardiology Office Note:    Date:  10/29/2022   ID:  Alessandro Prisock, DOB 03-29-1951, MRN [id number]  PCP:  Maryella Shivers, MD  Cardiologist:  Jenean Lindau, MD   Referring MD: Maryella Shivers, MD    ASSESSMENT:    1. Coronary artery disease involving native coronary artery of native heart without angina pectoris   2. Type 2 diabetes mellitus with hyperosmolarity without coma, without long-term current use of insulin ([place])   3. S/P CABG x 2   4. S/P mitral valve repair   5. Mixed hyperlipidemia   6. Pre-operative cardiovascular examination    PLAN:    In order of problems listed above:  Coronary artery disease post CABG surgery, secondary prevention stressed with the patient.  Importance of compliance with diet medication stressed any vocalized understanding.  He does not exercise on a regular basis.  He was advised against sedentary lifestyle. Preop cardiovascular evaluation: In view of the above I discussed Lexiscan sestamibi and he is get it done in the next few days.  She is planning to undergo lithotripsy. Post mitral valve repair: Echocardiogram will be done to assess the status of the mitral valve. Cardiomyopathy: Will do an echocardiogram to assess ejection fraction and advise him accordingly. Cigarette smoker: I spent 5 minutes with the patient discussing solely about smoking. Smoking cessation was counseled. I suggested to the patient also different medications and pharmacological interventions. Patient is keen to try stopping on its own at this time. He will get back to me if he needs any further assistance in this matter. Opinion about preop stratification from cardiovascular standpoint will be done after the results of the aforementioned test.Patient will be seen in follow-up appointment in 6 months or earlier if the patient has any concerns.   Addendum: Patient's ejection fraction is moderately depressed.  No evidence of ischemia on the nuclear scan.  In view of this  he is not at high risk for coronary events during the aforementioned procedure.  He remains at moderate risk in view of multiple comorbidities, continued heavy smoking and moderately depressed ejection fraction.  Close hemodynamic monitoring and uninterrupted continued beta-blocker will further reduce the risk of coronary events.  Please do not hesitate to contact if you have any questions in his cardiovascular management. Signed Dr. Sunny Schlein Stefany Starace 11/02/2022    Medication Adjustments/Labs and Tests Ordered: Current medicines are reviewed at length with the patient today.  Concerns regarding medicines are outlined above.  No orders of the defined types were placed in this encounter.  No orders of the defined types were placed in this encounter.    No chief complaint on file.    History of Present Illness:    Nicholous Cleary is a 72 y.o. male.  Patient has past medical history of coronary artery disease post CABG surgery, essential hypertension, mixed dyslipidemia, mitral valve repair.  He has history of moderately depressed ejection fraction.  He leads a sedentary lifestyle.  Unfortunately continues to smoke.  At the time of my evaluation, the patient is alert awake oriented and in no distress.  Past Medical History:  Diagnosis Date   Anterior urethral stricture 12/11/2019   Bladder diverticulum 01/09/2020   Continuous dependence on cigarette smoking 08/23/2021   Coronary artery disease 05/03/2021   DM type 2 (diabetes mellitus, type 2) ([place])    Hyperlipidemia    Incomplete emptying of bladder 01/09/2020   Lower urinary tract symptoms (LUTS) 10/20/2019   Nonrheumatic mitral valve regurgitation    Protein-calorie  malnutrition, severe 05/02/2021   Retention of urine, unspecified 10/24/2022   S/P CABG x 2 05/03/2021   SVG to OM SVG to PDA   S/P mitral valve repair 05/03/2021   Size 32 mm Annuloplasty ring   Severe mitral regurgitation 04/26/2021   Suprapubic catheter ([place]) 03/29/2020    Syncope and collapse     Past Surgical History:  Procedure Laterality Date   CORONARY ARTERY BYPASS GRAFT N/A 05/03/2021   Procedure: CORONARY ARTERY BYPASS GRAFTING (CABG) TIMES TWO ON PUMP USING ENDOSCOPICALLY HARVESTED RIGHT GREATER SAPHENOUS VEIN;  Surgeon: Melrose Nakayama, MD;  Location: [place];  Service: Open Heart Surgery;  Laterality: N/A;   ENDOVEIN HARVEST OF GREATER SAPHENOUS VEIN Right 05/03/2021   Procedure: ENDOVEIN HARVEST OF GREATER SAPHENOUS VEIN;  Surgeon: Melrose Nakayama, MD;  Location: [place];  Service: Open Heart Surgery;  Laterality: Right;   MITRAL VALVE REPAIR N/A 05/03/2021   Procedure: MITRAL VALVE REPAIR USING MEDTRONIC SIMUFORM 32MM ANNULOPLASTY RING (MVR);  Surgeon: Melrose Nakayama, MD;  Location: [place];  Service: Open Heart Surgery;  Laterality: N/A;   RIGHT/LEFT HEART CATH AND CORONARY ANGIOGRAPHY N/A 04/26/2021   Procedure: RIGHT/LEFT HEART CATH AND CORONARY ANGIOGRAPHY;  Surgeon: Burnell Blanks, MD;  Location: [facility] [facility];  Service: Cardiovascular;  Laterality: N/A;   TEE WITHOUT CARDIOVERSION N/A 04/28/2021   Procedure: TRANSESOPHAGEAL ECHOCARDIOGRAM (TEE);  Surgeon: Fay Records, MD;  Location: [place];  Service: Cardiovascular;  Laterality: N/A;   TEE WITHOUT CARDIOVERSION N/A 05/03/2021   Procedure: TRANSESOPHAGEAL ECHOCARDIOGRAM (TEE);  Surgeon: Melrose Nakayama, MD;  Location: [place];  Service: Open Heart Surgery;  Laterality: N/A;   URETERAL STENT PLACEMENT      Current Medications: Current Meds  Medication Sig   atorvastatin (LIPITOR) 10 MG tablet Take 10 mg by mouth daily.   Cholecalciferol 25 MCG (1000 UT) tablet Take 1,000 Units by mouth daily.   cyanocobalamin 100 MCG tablet Take 100 mcg by mouth daily.   ferrous gluconate (FERGON) 324 MG tablet Take 1 tablet (324 mg total) by mouth 2 (two) times daily with a meal.   JARDIANCE 25 MG TABS tablet Take 25 mg by mouth daily.   metFORMIN (GLUCOPHAGE) 1000 MG  tablet Take 1,000 mg by mouth 2 (two) times daily.   metoprolol succinate (TOPROL-XL) 25 MG 24 hr tablet Take 1 tablet (25 mg total) by mouth daily.   oxybutynin (DITROPAN) 5 MG tablet Take 5 mg by mouth daily as needed for bladder spasms.     Allergies:   Neosporin [neomycin-bacitracin zn-polymyx]   Social History   Socioeconomic History   Marital status: Married    Spouse name: Not on file   Number of children: Not on file   Years of education: Not on file   Highest education level: Not on file  Occupational History   Not on file  Tobacco Use   Smoking status: Every Day    Packs/day: .5    Types: Cigarettes   Smokeless tobacco: Never  Substance and Sexual Activity   Alcohol use: Yes    Comment: social drinker   Drug use: No   Sexual activity: Not on file  Other Topics Concern   Not on file  Social History Narrative   Not on file   Social Determinants of Health   Financial Resource Strain: Not on file  Food Insecurity: Not on file  Transportation Needs: Not on file  Physical Activity: Not on file  Stress: Not on  file  Social Connections: Not on file     Family History: The patient's family history includes Hyperlipidemia in his father. There is no history of Hypertension, Heart disease, Diabetes, or Cancer.  ROS:   Please see the history of present illness.    All other systems reviewed and are negative.  EKGs/Labs/Other Studies Reviewed:    The following studies were reviewed today: EKG revealed sinus rhythm and nonspecific ST-T changes   Recent Labs: No results found for requested labs within last 365 days.  Recent Lipid Panel    Component Value Date/Time   CHOL 143 04/27/2021 0224   TRIG 57 04/27/2021 0224   HDL 47 04/27/2021 0224   CHOLHDL 3.0 04/27/2021 0224   VLDL 11 04/27/2021 0224   LDLCALC 85 04/27/2021 0224    Physical Exam:    VS:  BP 116/72   Pulse (!) 107   Ht 6' (1.829 m)   Wt 142 lb 6.4 oz (64.6 kg)   SpO2 99%   BMI 19.31  kg/m     Wt Readings from Last 3 Encounters:  10/29/22 142 lb 6.4 oz (64.6 kg)  02/22/22 150 lb 11 oz (68.4 kg)  08/23/21 159 lb 6.4 oz (72.3 kg)     GEN: Patient is in no acute distress HEENT: Normal NECK: No JVD; No carotid bruits LYMPHATICS: No lymphadenopathy CARDIAC: Hear sounds regular, 2/6 systolic murmur at the apex. RESPIRATORY:  Clear to auscultation without rales, wheezing or rhonchi  ABDOMEN: Soft, non-tender, non-distended MUSCULOSKELETAL:  No edema; No deformity  SKIN: Warm and dry NEUROLOGIC:  Alert and oriented x 3 PSYCHIATRIC:  Normal affect   Signed, Jenean Lindau, MD  10/29/2022 12:39 PM    [place]

## 2022-10-29 NOTE — Patient Instructions (Signed)
Medication Instructions:  Your physician recommends that you continue on your current medications as directed. Please refer to the Current Medication list given to you today.  *If you need a refill on your cardiac medications before your next appointment, please call your pharmacy*   Lab Work: None ordered If you have labs (blood work) drawn today and your tests are completely normal, you will receive your results only by: MyChart Message (if you have MyChart) OR A paper copy in the mail If you have any lab test that is abnormal or we need to change your treatment, we will call you to review the results.   Testing/Procedures: You are scheduled for a Myocardial Perfusion Imaging Study.  Please arrive 15 minutes prior to your appointment time for registration and insurance purposes.  The test will take approximately 3 to 4 hours to complete; you may bring reading material.  If someone comes with you to your appointment, they will need to remain in the main lobby due to limited space in the testing area.   How to prepare for your Myocardial Perfusion Test: Do not eat or drink 3 hours prior to your test, except you may have water. Do not consume products containing caffeine (regular or decaffeinated) 12 hours prior to your test. (ex: coffee, chocolate, sodas, tea). Do bring a list of your current medications with you.  If not listed below, you may take your medications as normal. Do wear comfortable clothes (no dresses or overalls) and walking shoes, tennis shoes preferred (No heels or open toe shoes are allowed). Do NOT wear cologne, perfume, aftershave, or lotions (deodorant is allowed). If these instructions are not followed, your test will have to be rescheduled.  If you cannot keep your appointment, please provide 24 hours notification to the Nuclear Lab, to avoid a possible $50 charge to your account.   Your physician has requested that you have an echocardiogram. Echocardiography is  a painless test that uses sound waves to create images of your heart. It provides your doctor with information about the size and shape of your heart and how well your heart's chambers and valves are working. This procedure takes approximately one hour. There are no restrictions for this procedure. Please do NOT wear cologne, perfume, aftershave, or lotions (deodorant is allowed). Please arrive 15 minutes prior to your appointment time.   Follow-Up: At [facility], you and your health needs are our priority.  As part of our continuing mission to provide you with exceptional heart care, we have created designated Provider Care Teams.  These Care Teams include your primary Cardiologist (physician) and Advanced Practice Providers (APPs -  Physician Assistants and Nurse Practitioners) who all work together to provide you with the care you need, when you need it.  We recommend signing up for the patient portal called "MyChart".  Sign up information is provided on this After Visit Summary.  MyChart is used to connect with patients for Virtual Visits (Telemedicine).  Patients are able to view lab/test results, encounter notes, upcoming appointments, etc.  Non-urgent messages can be sent to your provider as well.   To learn more about what you can do with MyChart, go to [email or web address].    Your next appointment:   6 month(s)  Provider:   Rajan Revankar, MD   Other Instructions  Cardiac Nuclear Scan A cardiac nuclear scan is a test that is done to check the flow of blood to your heart. It is done when you are resting and   when you are exercising. The test looks for problems such as: Not enough blood reaching a portion of the heart. The heart muscle not working as it should. You may need this test if you have: Heart disease. Lab results that are not normal. Had heart surgery or a balloon procedure to open up blocked arteries (angioplasty) or a small mesh tube (stent). Chest  pain. Shortness of breath. Had a heart attack. In this test, a special dye (tracer) is put into your bloodstream. The tracer will travel to your heart. A camera will then take pictures of your heart to see how the tracer moves through your heart. This test is usually done at a hospital and takes 2-4 hours. Tell a doctor about: Any allergies you have. All medicines you are taking, including vitamins, herbs, eye drops, creams, and over-the-counter medicines. Any bleeding problems you have. Any surgeries you have had. Any medical conditions you have. Whether you are pregnant or may be pregnant. Any history of asthma or long-term (chronic) lung disease. Any history of heart rhythm disorders or heart valve conditions. What are the risks? Your doctor will talk with you about risks. These may include: Serious chest pain and heart attack. This is only a risk if the stress portion of the test is done. Fast or uneven heartbeats (palpitations). A feeling of warmth in your chest. This feeling usually does not last long. Allergic reaction to the tracer. Shortness of breath or trouble breathing. What happens before the test? Ask your doctor about changing or stopping your normal medicines. Follow instructions from your doctor about what you cannot eat or drink. Remove your jewelry on the day of the test. Ask your doctor if you need to avoid nicotine or caffeine. What happens during the test? An IV tube will be inserted into one of your veins. Your doctor will give you a small amount of tracer through the IV tube. You will wait for 20-40 minutes while the tracer moves through your bloodstream. Your heart will be monitored with an electrocardiogram (ECG). You will lie down on an exam table. Pictures of your heart will be taken for about 15-20 minutes. You may also have a stress test. For this test, one of these things may be done: You will be asked to exercise on a treadmill or a stationary  bike. You will be given medicines that will make your heart work harder. This is done if you are unable to exercise. When blood flow to your heart has peaked, a tracer will again be given through the IV tube. After 20-40 minutes, you will get back on the exam table. More pictures will be taken of your heart. Depending on the tracer that is used, more pictures may need to be taken 3-4 hours later. Your IV tube will be removed when the test is over. The test may vary among doctors and hospitals. What happens after the test? Ask your doctor: Whether you can return to your normal schedule, including diet, activities, travel, and medicines. Whether you should drink more fluids. This will help to remove the tracer from your body. Ask your doctor, or the department that is doing the test: When will my results be ready? How will I get my results? What are my treatment options? What other tests do I need? What are my next steps? This information is not intended to replace advice given to you by your health care provider. Make sure you discuss any questions you have with your health care provider.   Document Revised: 12/19/2021 Document Reviewed: 12/19/2021 Elsevier Patient Education  2023 Elsevier Inc.  Echocardiogram An echocardiogram is a test that uses sound waves (ultrasound) to produce images of the heart. Images from an echocardiogram can provide important information about: Heart size and shape. The size and thickness and movement of your heart's walls. Heart muscle function and strength. Heart valve function or if you have stenosis. Stenosis is when the heart valves are too narrow. If blood is flowing backward through the heart valves (regurgitation). A tumor or infectious growth around the heart valves. Areas of heart muscle that are not working well because of poor blood flow or injury from a heart attack. Aneurysm detection. An aneurysm is a weak or damaged part of an artery wall. The  wall bulges out from the normal force of blood pumping through the body. Tell a health care provider about: Any allergies you have. All medicines you are taking, including vitamins, herbs, eye drops, creams, and over-the-counter medicines. Any blood disorders you have. Any surgeries you have had. Any medical conditions you have. Whether you are pregnant or may be pregnant. What are the risks? Generally, this is a safe test. However, problems may occur, including an allergic reaction to dye (contrast) that may be used during the test. What happens before the test? No specific preparation is needed. You may eat and drink normally. What happens during the test?  You will take off your clothes from the waist up and put on a hospital gown. Electrodes or electrocardiogram (ECG)patches may be placed on your chest. The electrodes or patches are then connected to a device that monitors your heart rate and rhythm. You will lie down on a table for an ultrasound exam. A gel will be applied to your chest to help sound waves pass through your skin. A handheld device, called a transducer, will be pressed against your chest and moved over your heart. The transducer produces sound waves that travel to your heart and bounce back (or "echo" back) to the transducer. These sound waves will be captured in real-time and changed into images of your heart that can be viewed on a video monitor. The images will be recorded on a computer and reviewed by your health care provider. You may be asked to change positions or hold your breath for a short time. This makes it easier to get different views or better views of your heart. In some cases, you may receive contrast through an IV in one of your veins. This can improve the quality of the pictures from your heart. The procedure may vary among health care providers and hospitals. What can I expect after the test? You may return to your normal, everyday life, including diet,  activities, and medicines, unless your health care provider tells you not to do that. Follow these instructions at home: It is up to you to get the results of your test. Ask your health care provider, or the department that is doing the test, when your results will be ready. Keep all follow-up visits. This is important. Summary An echocardiogram is a test that uses sound waves (ultrasound) to produce images of the heart. Images from an echocardiogram can provide important information about the size and shape of your heart, heart muscle function, heart valve function, and other possible heart problems. You do not need to do anything to prepare before this test. You may eat and drink normally. After the echocardiogram is completed, you may return to your normal, everyday life,   unless your health care provider tells you not to do that. This information is not intended to replace advice given to you by your health care provider. Make sure you discuss any questions you have with your health care provider. Document Revised: 04/05/2021 Document Reviewed: 03/15/2020 Elsevier Patient Education  2023 Elsevier Inc.    

## 2022-10-30 ENCOUNTER — Telehealth (HOSPITAL_COMMUNITY): Payer: Self-pay | Admitting: *Deleted

## 2022-10-30 NOTE — Telephone Encounter (Signed)
Patient given detailed instructions per Myocardial Perfusion Study Information Sheet for the test on  11/01/22 Patient notified to arrive 15 minutes early and that it is imperative to arrive on time for appointment to keep from having the test rescheduled.  If you need to cancel or reschedule your appointment, please call the office within 24 hours of your appointment. . Patient verbalized understanding. Kirstie Peri

## 2022-11-01 ENCOUNTER — Ambulatory Visit: Payer: Medicare Other | Attending: Cardiology

## 2022-11-01 ENCOUNTER — Ambulatory Visit (INDEPENDENT_AMBULATORY_CARE_PROVIDER_SITE_OTHER): Payer: Medicare Other

## 2022-11-01 DIAGNOSIS — Z01810 Encounter for preprocedural cardiovascular examination: Secondary | ICD-10-CM | POA: Diagnosis not present

## 2022-11-01 DIAGNOSIS — I2510 Atherosclerotic heart disease of native coronary artery without angina pectoris: Secondary | ICD-10-CM

## 2022-11-01 LAB — MYOCARDIAL PERFUSION IMAGING
LV dias vol: 154 mL (ref 62–150)
LV sys vol: 91 mL
Nuc Stress EF: 41 %
Peak HR: 89 {beats}/min
Rest HR: 75 {beats}/min
Rest Nuclear Isotope Dose: 11 mCi
SDS: 4
SRS: 20
SSS: 24
Stress Nuclear Isotope Dose: 31.1 mCi
TID: 1.17

## 2022-11-01 LAB — ECHOCARDIOGRAM COMPLETE
Calc EF: 40.3 %
MV M vel: 5.46 m/s
MV Peak grad: 119.2 mmHg
MV VTI: 1.29 cm2
Radius: 0.6 cm
S' Lateral: 4.8 cm
Single Plane A2C EF: 37.7 %
Single Plane A4C EF: 40 %

## 2022-11-01 MED ORDER — TECHNETIUM TC 99M TETROFOSMIN IV KIT
31.1000 | PACK | Freq: Once | INTRAVENOUS | Status: AC | PRN
  Administered 2022-11-01: 31.1 via INTRAVENOUS

## 2022-11-01 MED ORDER — REGADENOSON 0.4 MG/5ML IV SOLN
0.4000 mg | Freq: Once | INTRAVENOUS | Status: AC
  Administered 2022-11-01: 0.4 mg via INTRAVENOUS

## 2022-11-01 MED ORDER — PERFLUTREN LIPID MICROSPHERE
1.0000 mL | INTRAVENOUS | Status: AC | PRN
  Administered 2022-11-01: 6 mL via INTRAVENOUS

## 2022-11-01 MED ORDER — TECHNETIUM TC 99M TETROFOSMIN IV KIT
11.0000 | PACK | Freq: Once | INTRAVENOUS | Status: AC | PRN
  Administered 2022-11-01: 11 via INTRAVENOUS

## 2022-11-06 DIAGNOSIS — N210 Calculus in bladder: Secondary | ICD-10-CM | POA: Diagnosis not present

## 2022-11-06 DIAGNOSIS — N3000 Acute cystitis without hematuria: Secondary | ICD-10-CM | POA: Diagnosis not present

## 2022-11-06 DIAGNOSIS — Z79899 Other long term (current) drug therapy: Secondary | ICD-10-CM | POA: Diagnosis not present

## 2022-11-06 DIAGNOSIS — N3020 Other chronic cystitis without hematuria: Secondary | ICD-10-CM | POA: Diagnosis not present

## 2022-11-06 DIAGNOSIS — N3289 Other specified disorders of bladder: Secondary | ICD-10-CM | POA: Diagnosis not present

## 2022-11-06 DIAGNOSIS — Z7984 Long term (current) use of oral hypoglycemic drugs: Secondary | ICD-10-CM | POA: Diagnosis not present

## 2022-11-06 DIAGNOSIS — Z87442 Personal history of urinary calculi: Secondary | ICD-10-CM | POA: Diagnosis not present

## 2022-11-28 DIAGNOSIS — R339 Retention of urine, unspecified: Secondary | ICD-10-CM | POA: Diagnosis not present

## 2022-12-03 DIAGNOSIS — E1129 Type 2 diabetes mellitus with other diabetic kidney complication: Secondary | ICD-10-CM | POA: Diagnosis not present

## 2022-12-03 DIAGNOSIS — E785 Hyperlipidemia, unspecified: Secondary | ICD-10-CM | POA: Diagnosis not present

## 2022-12-05 DIAGNOSIS — I2510 Atherosclerotic heart disease of native coronary artery without angina pectoris: Secondary | ICD-10-CM | POA: Diagnosis not present

## 2022-12-05 DIAGNOSIS — Z681 Body mass index (BMI) 19 or less, adult: Secondary | ICD-10-CM | POA: Diagnosis not present

## 2022-12-05 DIAGNOSIS — E785 Hyperlipidemia, unspecified: Secondary | ICD-10-CM | POA: Diagnosis not present

## 2022-12-05 DIAGNOSIS — I509 Heart failure, unspecified: Secondary | ICD-10-CM | POA: Diagnosis not present

## 2022-12-05 DIAGNOSIS — E1129 Type 2 diabetes mellitus with other diabetic kidney complication: Secondary | ICD-10-CM | POA: Diagnosis not present

## 2022-12-21 DIAGNOSIS — Z466 Encounter for fitting and adjustment of urinary device: Secondary | ICD-10-CM | POA: Diagnosis not present

## 2022-12-21 DIAGNOSIS — R339 Retention of urine, unspecified: Secondary | ICD-10-CM | POA: Diagnosis not present

## 2022-12-24 DIAGNOSIS — R339 Retention of urine, unspecified: Secondary | ICD-10-CM | POA: Diagnosis not present

## 2022-12-24 DIAGNOSIS — Z48816 Encounter for surgical aftercare following surgery on the genitourinary system: Secondary | ICD-10-CM | POA: Diagnosis not present

## 2023-01-14 DIAGNOSIS — Z09 Encounter for follow-up examination after completed treatment for conditions other than malignant neoplasm: Secondary | ICD-10-CM | POA: Diagnosis not present

## 2023-01-14 DIAGNOSIS — Z9359 Other cystostomy status: Secondary | ICD-10-CM | POA: Diagnosis not present

## 2023-01-14 DIAGNOSIS — R339 Retention of urine, unspecified: Secondary | ICD-10-CM | POA: Diagnosis not present

## 2023-01-14 DIAGNOSIS — N210 Calculus in bladder: Secondary | ICD-10-CM | POA: Diagnosis not present

## 2023-02-04 DIAGNOSIS — R339 Retention of urine, unspecified: Secondary | ICD-10-CM | POA: Diagnosis not present

## 2023-02-04 DIAGNOSIS — Z466 Encounter for fitting and adjustment of urinary device: Secondary | ICD-10-CM | POA: Diagnosis not present

## 2023-02-21 DIAGNOSIS — Z681 Body mass index (BMI) 19 or less, adult: Secondary | ICD-10-CM | POA: Diagnosis not present

## 2023-02-21 DIAGNOSIS — L400 Psoriasis vulgaris: Secondary | ICD-10-CM | POA: Diagnosis not present

## 2023-02-25 DIAGNOSIS — R339 Retention of urine, unspecified: Secondary | ICD-10-CM | POA: Diagnosis not present

## 2023-02-25 DIAGNOSIS — Z466 Encounter for fitting and adjustment of urinary device: Secondary | ICD-10-CM | POA: Diagnosis not present

## 2023-03-12 ENCOUNTER — Other Ambulatory Visit: Payer: Self-pay | Admitting: Cardiology

## 2023-03-12 NOTE — Telephone Encounter (Signed)
*  STAT* If patient is at the pharmacy, call can be transferred to refill team.   1. Which medications need to be refilled? (please list name of each medication and dose if known) Metoprolol   2. Would you like to learn more about the convenience, safety, & potential cost savings by using the [facility]?     3. Are you open to using the [facility] (Type [facility].    4. Which pharmacy/location (including street and city if local pharmacy) is medication to be sent to? [facility] [address]  They need a 30 day or 90 day supply? #90 and refills- please call today- out of medicine

## 2023-03-18 DIAGNOSIS — R339 Retention of urine, unspecified: Secondary | ICD-10-CM | POA: Diagnosis not present

## 2023-03-18 DIAGNOSIS — Z466 Encounter for fitting and adjustment of urinary device: Secondary | ICD-10-CM | POA: Diagnosis not present

## 2023-04-03 DIAGNOSIS — E1129 Type 2 diabetes mellitus with other diabetic kidney complication: Secondary | ICD-10-CM | POA: Diagnosis not present

## 2023-04-03 DIAGNOSIS — E785 Hyperlipidemia, unspecified: Secondary | ICD-10-CM | POA: Diagnosis not present

## 2023-04-10 DIAGNOSIS — R339 Retention of urine, unspecified: Secondary | ICD-10-CM | POA: Diagnosis not present

## 2023-04-19 ENCOUNTER — Ambulatory Visit: Payer: Medicare Other | Attending: Cardiology | Admitting: Cardiology

## 2023-04-19 ENCOUNTER — Encounter: Payer: Self-pay | Admitting: Cardiology

## 2023-04-19 VITALS — BP 122/70 | HR 83 | Ht 72.0 in | Wt 137.8 lb

## 2023-04-19 DIAGNOSIS — I25118 Atherosclerotic heart disease of native coronary artery with other forms of angina pectoris: Secondary | ICD-10-CM

## 2023-04-19 DIAGNOSIS — F17210 Nicotine dependence, cigarettes, uncomplicated: Secondary | ICD-10-CM | POA: Diagnosis not present

## 2023-04-19 DIAGNOSIS — Z681 Body mass index (BMI) 19 or less, adult: Secondary | ICD-10-CM | POA: Diagnosis not present

## 2023-04-19 DIAGNOSIS — Z9889 Other specified postprocedural states: Secondary | ICD-10-CM | POA: Insufficient documentation

## 2023-04-19 DIAGNOSIS — E782 Mixed hyperlipidemia: Secondary | ICD-10-CM | POA: Insufficient documentation

## 2023-04-19 DIAGNOSIS — Z23 Encounter for immunization: Secondary | ICD-10-CM | POA: Diagnosis not present

## 2023-04-19 DIAGNOSIS — Z951 Presence of aortocoronary bypass graft: Secondary | ICD-10-CM | POA: Diagnosis not present

## 2023-04-19 DIAGNOSIS — I2510 Atherosclerotic heart disease of native coronary artery without angina pectoris: Secondary | ICD-10-CM | POA: Insufficient documentation

## 2023-04-19 DIAGNOSIS — I2089 Other forms of angina pectoris: Secondary | ICD-10-CM | POA: Insufficient documentation

## 2023-04-19 DIAGNOSIS — E785 Hyperlipidemia, unspecified: Secondary | ICD-10-CM | POA: Diagnosis not present

## 2023-04-19 DIAGNOSIS — E1129 Type 2 diabetes mellitus with other diabetic kidney complication: Secondary | ICD-10-CM | POA: Diagnosis not present

## 2023-04-19 DIAGNOSIS — I509 Heart failure, unspecified: Secondary | ICD-10-CM | POA: Diagnosis not present

## 2023-04-19 HISTORY — DX: Other forms of angina pectoris: I20.89

## 2023-04-19 MED ORDER — ATORVASTATIN CALCIUM 20 MG PO TABS: 20.0000 mg | ORAL_TABLET | Freq: Every day | ORAL | 3 refills | Status: DC

## 2023-04-19 MED ORDER — ASPIRIN 81 MG PO TBEC: 81.0000 mg | DELAYED_RELEASE_TABLET | Freq: Every day | ORAL | 3 refills | Status: AC

## 2023-04-19 NOTE — Patient Instructions (Signed)
Medication Instructions:  Your physician has recommended you make the following change in your medication:   START: Aspirin 81 mg daily START: Lipitor 20 mg daily  *If you need a refill on your cardiac medications before your next appointment, please call your pharmacy*   Lab Work: Your physician recommends that you return for lab work in:   Labs in 6 weeks: BMP, LFT, Lipid  If you have labs (blood work) drawn today and your tests are completely normal, you will receive your results only by: MyChart Message (if you have MyChart) OR A paper copy in the mail If you have any lab test that is abnormal or we need to change your treatment, we will call you to review the results.   Testing/Procedures: None   Follow-Up: At [facility], you and your health needs are our priority.  As part of our continuing mission to provide you with exceptional heart care, we have created designated Provider Care Teams.  These Care Teams include your primary Cardiologist (physician) and Advanced Practice Providers (APPs -  Physician Assistants and Nurse Practitioners) who all work together to provide you with the care you need, when you need it.  We recommend signing up for the patient portal called "MyChart".  Sign up information is provided on this After Visit Summary.  MyChart is used to connect with patients for Virtual Visits (Telemedicine).  Patients are able to view lab/test results, encounter notes, upcoming appointments, etc.  Non-urgent messages can be sent to your provider as well.   To learn more about what you can do with MyChart, go to [email or web address].    Your next appointment:   9 month(s)  Provider:   Belva Crome, MD    Other Instructions None

## 2023-04-19 NOTE — Progress Notes (Signed)
Cardiology Office Note:    Date:  04/19/2023   ID:  Joseph Robles, DOB 21-Oct-1950, MRN [id number]  PCP:  Charlott Rakes, MD  Cardiologist:  Garwin Brothers, MD   Referring MD: Charlott Rakes, MD    ASSESSMENT:    1. Continuous dependence on cigarette smoking   2. Coronary artery disease involving native coronary artery of native heart without angina pectoris   3. S/P CABG x 2   4. S/P mitral valve repair   5. Stable angina pectoris   6. Mixed hyperlipidemia    PLAN:    In order of problems listed above:  Coronary artery disease: Stable angina pectoris: Secondary prevention stressed with the patient.  Importance of compliance with diet medication stressed and vocalized understanding.  He was advised to walk at least half an hour a day on a daily basis and he promises to do so.  Per his request we will try to enroll him into a cardiac rehab program.  I told him that his insurance company will have to approve this. Essential hypertension: Blood pressure stable and diet was emphasized. Mixed dyslipidemia: On lipid-lowering medications followed by primary care.  I reviewed lipids goal LDL must be less than 60.  Diet emphasized.  Statin stable daily back in 6 weeks for liver lipid check. Cigarette smoker: I spent 5 minutes with the patient discussing solely about smoking. Smoking cessation was counseled. I suggested to the patient also different medications and pharmacological interventions. Patient is keen to try stopping on its own at this time. He will get back to me if he needs any further assistance in this matter. Patient will be seen in follow-up appointment in 6 months or earlier if the patient has any concerns.    Medication Adjustments/Labs and Tests Ordered: Current medicines are reviewed at length with the patient today.  Concerns regarding medicines are outlined above.  No orders of the defined types were placed in this encounter.  No orders of the defined types were  placed in this encounter.    No chief complaint on file.    History of Present Illness:    [name] is a 72 y.o. male.  Patient has past medical history of coronary artery disease, mitral valve repair, essential hypertension, mixed dyslipidemia and diabetes mellitus.  Unfortunately he continues to smoke.  He denies any chest pain orthopnea or PND and takes care of activities of daily living.  Only occasionally has chest tightness when he exerts himself more than usual.  This happens about once in 3 to 4 months.  He is interested in getting into cardiac rehab program.  At the time of my evaluation, the patient is alert awake oriented and in no distress.  Past Medical History:  Diagnosis Date   Anterior urethral stricture 12/11/2019   Bladder diverticulum 01/09/2020   Continuous dependence on cigarette smoking 08/23/2021   Coronary artery disease 05/03/2021   DM type 2 (diabetes mellitus, type 2) (HCC)    Hyperlipidemia    Incomplete emptying of bladder 01/09/2020   Lower urinary tract symptoms (LUTS) 10/20/2019   Nonrheumatic mitral valve regurgitation    Protein-calorie malnutrition, severe 05/02/2021   Retention of urine, unspecified 10/24/2022   S/P CABG x 2 05/03/2021   SVG to OM SVG to PDA   S/P mitral valve repair 05/03/2021   Size 32 mm Annuloplasty ring   Severe mitral regurgitation 04/26/2021   Suprapubic catheter (HCC) 03/29/2020   Syncope and collapse     Past Surgical  History:  Procedure Laterality Date   CORONARY ARTERY BYPASS GRAFT N/A 05/03/2021   Procedure: CORONARY ARTERY BYPASS GRAFTING (CABG) TIMES TWO ON PUMP USING ENDOSCOPICALLY HARVESTED RIGHT GREATER SAPHENOUS VEIN;  Surgeon: Loreli Slot, MD;  Location: [facility] OR;  Service: Open Heart Surgery;  Laterality: N/A;   ENDOVEIN HARVEST OF GREATER SAPHENOUS VEIN Right 05/03/2021   Procedure: ENDOVEIN HARVEST OF GREATER SAPHENOUS VEIN;  Surgeon: Loreli Slot, MD;  Location: [facility] OR;  Service: Open  Heart Surgery;  Laterality: Right;   MITRAL VALVE REPAIR N/A 05/03/2021   Procedure: MITRAL VALVE REPAIR USING MEDTRONIC SIMUFORM ANNULOPLASTY RING (MVR);  Surgeon: Loreli Slot, MD;  Location: [facility] OR;  Service: Open Heart Surgery;  Laterality: N/A;   RIGHT/LEFT HEART CATH AND CORONARY ANGIOGRAPHY N/A 04/26/2021   Procedure: RIGHT/LEFT HEART CATH AND CORONARY ANGIOGRAPHY;  Surgeon: Kathleene Hazel, MD;  Location: [facility] INVASIVE CV LAB;  Service: Cardiovascular;  Laterality: N/A;   TEE WITHOUT CARDIOVERSION N/A 04/28/2021   Procedure: TRANSESOPHAGEAL ECHOCARDIOGRAM (TEE);  Surgeon: Pricilla Riffle, MD;  Location: [facility] ENDOSCOPY;  Service: Cardiovascular;  Laterality: N/A;   TEE WITHOUT CARDIOVERSION N/A 05/03/2021   Procedure: TRANSESOPHAGEAL ECHOCARDIOGRAM (TEE);  Surgeon: Loreli Slot, MD;  Location: [facility] OR;  Service: Open Heart Surgery;  Laterality: N/A;   URETERAL STENT PLACEMENT      Current Medications: Current Meds  Medication Sig   atorvastatin (LIPITOR) 10 MG tablet Take 10 mg by mouth daily.   Cholecalciferol 25 MCG (1000 UT) tablet Take 1,000 Units by mouth daily.   cyanocobalamin 100 MCG tablet Take 100 mcg by mouth daily.   ferrous gluconate (FERGON) 324 MG tablet Take 1 tablet (324 mg total) by mouth 2 (two) times daily with a meal.   JARDIANCE 25 MG TABS tablet Take 25 mg by mouth daily.   metFORMIN (GLUCOPHAGE) 1000 MG tablet Take 1,000 mg by mouth 2 (two) times daily.   metoprolol succinate (TOPROL-XL) 25 MG 24 hr tablet Take 1 tablet (25 mg total) by mouth daily.   oxybutynin (DITROPAN) 5 MG tablet Take 5 mg by mouth daily as needed for bladder spasms.   [DISCONTINUED] aspirin EC 325 MG EC tablet Take 1 tablet (325 mg total) by mouth daily.     Allergies:   Neosporin [neomycin-bacitracin zn-polymyx]   Social History   Socioeconomic History   Marital status: Married    Spouse name: Not on file   Number of children: Not on file   Years of education:  Not on file   Highest education level: Not on file  Occupational History   Not on file  Tobacco Use   Smoking status: Every Day    Current packs/day: 0.50    Types: Cigarettes   Smokeless tobacco: Never  Substance and Sexual Activity   Alcohol use: Yes    Comment: social drinker   Drug use: No   Sexual activity: Not on file  Other Topics Concern   Not on file  Social History Narrative   Not on file   Social Determinants of Health   Financial Resource Strain: Not on file  Food Insecurity: Not on file  Transportation Needs: Not on file  Physical Activity: Not on file  Stress: Not on file  Social Connections: Not on file     Family History: The patient's family history includes Hyperlipidemia in his father. There is no history of Hypertension, Heart disease, Diabetes, or Cancer.  ROS:   Please see the history of present  illness.    All other systems reviewed and are negative.  EKGs/Labs/Other Studies Reviewed:    The following studies were reviewed today: I discussed my findings with the patient at length.   Recent Labs: No results found for requested labs within last 365 days.  Recent Lipid Panel    Component Value Date/Time   CHOL 143 04/27/2021 0224   TRIG 57 04/27/2021 0224   HDL 47 04/27/2021 0224   CHOLHDL 3.0 04/27/2021 0224   VLDL 11 04/27/2021 0224   LDLCALC 85 04/27/2021 0224    Physical Exam:    VS:  BP 122/70 (BP Location: Left Arm, Patient Position: Sitting, Cuff Size: Normal)   Pulse 83   Ht 6' (1.829 m)   Wt 137 lb 12.8 oz (62.5 kg)   SpO2 96%   BMI 18.69 kg/m     Wt Readings from Last 3 Encounters:  04/19/23 137 lb 12.8 oz (62.5 kg)  11/01/22 142 lb (64.4 kg)  10/29/22 142 lb 6.4 oz (64.6 kg)     GEN: Patient is in no acute distress HEENT: Normal NECK: No JVD; No carotid bruits LYMPHATICS: No lymphadenopathy CARDIAC: Hear sounds regular, 2/6 systolic murmur at the apex. RESPIRATORY:  Clear to auscultation without rales, wheezing  or rhonchi  ABDOMEN: Soft, non-tender, non-distended MUSCULOSKELETAL:  No edema; No deformity  SKIN: Warm and dry NEUROLOGIC:  Alert and oriented x 3 PSYCHIATRIC:  Normal affect   Signed, Garwin Brothers, MD  04/19/2023 1:54 PM    [facility]

## 2023-05-09 ENCOUNTER — Telehealth: Payer: Self-pay | Admitting: Cardiology

## 2023-05-09 MED ORDER — ATORVASTATIN CALCIUM 20 MG PO TABS: 20.0000 mg | ORAL_TABLET | Freq: Every day | ORAL | 3 refills | Status: DC

## 2023-05-09 NOTE — Addendum Note (Signed)
Addended by: Eleonore Chiquito on: 05/09/2023 04:48 PM   Modules accepted: Orders

## 2023-05-09 NOTE — Telephone Encounter (Signed)
Per DPR detailed message left that medication is the same only the dose changed from 10 mg to 20 mg.

## 2023-05-09 NOTE — Telephone Encounter (Signed)
Pt c/o medication issue:  1. Name of Medication: atorvastatin (LIPITOR) 20 MG tablet   2. How are you currently taking this medication (dosage and times per day)?    3. Are you having a reaction (difficulty breathing--STAT)? no  4. What is your medication issue? Patient thought on his last appt he was suppose to stop taking above medication. And he was suppose to be switch to another medication. Please advise

## 2023-05-09 NOTE — Telephone Encounter (Signed)
Advised pt of medication dose change. Pt verbalized understanding and had no additional questions.

## 2023-06-12 DIAGNOSIS — Z466 Encounter for fitting and adjustment of urinary device: Secondary | ICD-10-CM | POA: Diagnosis not present

## 2023-06-12 DIAGNOSIS — R339 Retention of urine, unspecified: Secondary | ICD-10-CM | POA: Diagnosis not present

## 2023-07-08 DIAGNOSIS — Z466 Encounter for fitting and adjustment of urinary device: Secondary | ICD-10-CM | POA: Diagnosis not present

## 2023-07-08 DIAGNOSIS — R339 Retention of urine, unspecified: Secondary | ICD-10-CM | POA: Diagnosis not present

## 2023-07-29 DIAGNOSIS — Z466 Encounter for fitting and adjustment of urinary device: Secondary | ICD-10-CM | POA: Diagnosis not present

## 2023-07-29 DIAGNOSIS — R339 Retention of urine, unspecified: Secondary | ICD-10-CM | POA: Diagnosis not present

## 2023-08-13 DIAGNOSIS — E785 Hyperlipidemia, unspecified: Secondary | ICD-10-CM | POA: Diagnosis not present

## 2023-08-13 DIAGNOSIS — E1129 Type 2 diabetes mellitus with other diabetic kidney complication: Secondary | ICD-10-CM | POA: Diagnosis not present

## 2023-08-19 DIAGNOSIS — Z136 Encounter for screening for cardiovascular disorders: Secondary | ICD-10-CM | POA: Diagnosis not present

## 2023-08-19 DIAGNOSIS — Z1389 Encounter for screening for other disorder: Secondary | ICD-10-CM | POA: Diagnosis not present

## 2023-08-19 DIAGNOSIS — Z7189 Other specified counseling: Secondary | ICD-10-CM | POA: Diagnosis not present

## 2023-08-19 DIAGNOSIS — I2510 Atherosclerotic heart disease of native coronary artery without angina pectoris: Secondary | ICD-10-CM | POA: Diagnosis not present

## 2023-08-19 DIAGNOSIS — Z139 Encounter for screening, unspecified: Secondary | ICD-10-CM | POA: Diagnosis not present

## 2023-08-19 DIAGNOSIS — E1129 Type 2 diabetes mellitus with other diabetic kidney complication: Secondary | ICD-10-CM | POA: Diagnosis not present

## 2023-08-19 DIAGNOSIS — Z0000 Encounter for general adult medical examination without abnormal findings: Secondary | ICD-10-CM | POA: Diagnosis not present

## 2023-08-19 DIAGNOSIS — E785 Hyperlipidemia, unspecified: Secondary | ICD-10-CM | POA: Diagnosis not present

## 2023-08-19 DIAGNOSIS — Z1331 Encounter for screening for depression: Secondary | ICD-10-CM | POA: Diagnosis not present

## 2023-08-19 DIAGNOSIS — Z681 Body mass index (BMI) 19 or less, adult: Secondary | ICD-10-CM | POA: Diagnosis not present

## 2023-08-19 DIAGNOSIS — Z1339 Encounter for screening examination for other mental health and behavioral disorders: Secondary | ICD-10-CM | POA: Diagnosis not present

## 2023-08-29 DIAGNOSIS — R339 Retention of urine, unspecified: Secondary | ICD-10-CM | POA: Diagnosis not present

## 2023-08-29 DIAGNOSIS — Z466 Encounter for fitting and adjustment of urinary device: Secondary | ICD-10-CM | POA: Diagnosis not present

## 2023-09-01 ENCOUNTER — Other Ambulatory Visit: Payer: Self-pay | Admitting: Cardiology

## 2023-09-23 DIAGNOSIS — Z466 Encounter for fitting and adjustment of urinary device: Secondary | ICD-10-CM | POA: Diagnosis not present

## 2023-09-23 DIAGNOSIS — R339 Retention of urine, unspecified: Secondary | ICD-10-CM | POA: Diagnosis not present

## 2023-10-21 DIAGNOSIS — R339 Retention of urine, unspecified: Secondary | ICD-10-CM | POA: Diagnosis not present

## 2023-11-18 DIAGNOSIS — Z466 Encounter for fitting and adjustment of urinary device: Secondary | ICD-10-CM | POA: Diagnosis not present

## 2023-11-18 DIAGNOSIS — R339 Retention of urine, unspecified: Secondary | ICD-10-CM | POA: Diagnosis not present

## 2023-12-04 DIAGNOSIS — L089 Local infection of the skin and subcutaneous tissue, unspecified: Secondary | ICD-10-CM | POA: Diagnosis not present

## 2023-12-04 DIAGNOSIS — L723 Sebaceous cyst: Secondary | ICD-10-CM | POA: Diagnosis not present

## 2023-12-04 DIAGNOSIS — Z681 Body mass index (BMI) 19 or less, adult: Secondary | ICD-10-CM | POA: Diagnosis not present

## 2023-12-09 DIAGNOSIS — E785 Hyperlipidemia, unspecified: Secondary | ICD-10-CM | POA: Diagnosis not present

## 2023-12-09 DIAGNOSIS — E1129 Type 2 diabetes mellitus with other diabetic kidney complication: Secondary | ICD-10-CM | POA: Diagnosis not present

## 2023-12-16 DIAGNOSIS — Z466 Encounter for fitting and adjustment of urinary device: Secondary | ICD-10-CM | POA: Diagnosis not present

## 2023-12-16 DIAGNOSIS — R339 Retention of urine, unspecified: Secondary | ICD-10-CM | POA: Diagnosis not present

## 2023-12-23 DIAGNOSIS — E785 Hyperlipidemia, unspecified: Secondary | ICD-10-CM | POA: Diagnosis not present

## 2023-12-23 DIAGNOSIS — I2510 Atherosclerotic heart disease of native coronary artery without angina pectoris: Secondary | ICD-10-CM | POA: Diagnosis not present

## 2023-12-23 DIAGNOSIS — Z681 Body mass index (BMI) 19 or less, adult: Secondary | ICD-10-CM | POA: Diagnosis not present

## 2023-12-23 DIAGNOSIS — E1129 Type 2 diabetes mellitus with other diabetic kidney complication: Secondary | ICD-10-CM | POA: Diagnosis not present

## 2024-01-15 DIAGNOSIS — R339 Retention of urine, unspecified: Secondary | ICD-10-CM | POA: Diagnosis not present

## 2024-01-15 DIAGNOSIS — N139 Obstructive and reflux uropathy, unspecified: Secondary | ICD-10-CM | POA: Diagnosis not present

## 2024-01-15 DIAGNOSIS — N210 Calculus in bladder: Secondary | ICD-10-CM | POA: Diagnosis not present

## 2024-01-15 DIAGNOSIS — Z9359 Other cystostomy status: Secondary | ICD-10-CM | POA: Diagnosis not present

## 2024-02-08 ENCOUNTER — Other Ambulatory Visit: Payer: Self-pay | Admitting: Cardiology

## 2024-02-10 DIAGNOSIS — R339 Retention of urine, unspecified: Secondary | ICD-10-CM | POA: Diagnosis not present

## 2024-02-10 DIAGNOSIS — Z466 Encounter for fitting and adjustment of urinary device: Secondary | ICD-10-CM | POA: Diagnosis not present

## 2024-03-09 DIAGNOSIS — R339 Retention of urine, unspecified: Secondary | ICD-10-CM | POA: Diagnosis not present

## 2024-03-09 DIAGNOSIS — Z466 Encounter for fitting and adjustment of urinary device: Secondary | ICD-10-CM | POA: Diagnosis not present

## 2024-04-08 DIAGNOSIS — Z466 Encounter for fitting and adjustment of urinary device: Secondary | ICD-10-CM | POA: Diagnosis not present

## 2024-04-08 DIAGNOSIS — R339 Retention of urine, unspecified: Secondary | ICD-10-CM | POA: Diagnosis not present

## 2024-04-22 DIAGNOSIS — E1129 Type 2 diabetes mellitus with other diabetic kidney complication: Secondary | ICD-10-CM | POA: Diagnosis not present

## 2024-04-22 DIAGNOSIS — E785 Hyperlipidemia, unspecified: Secondary | ICD-10-CM | POA: Diagnosis not present

## 2024-04-29 DIAGNOSIS — E785 Hyperlipidemia, unspecified: Secondary | ICD-10-CM | POA: Diagnosis not present

## 2024-04-29 DIAGNOSIS — E1129 Type 2 diabetes mellitus with other diabetic kidney complication: Secondary | ICD-10-CM | POA: Diagnosis not present

## 2024-04-29 DIAGNOSIS — Z23 Encounter for immunization: Secondary | ICD-10-CM | POA: Diagnosis not present

## 2024-04-29 DIAGNOSIS — I509 Heart failure, unspecified: Secondary | ICD-10-CM | POA: Diagnosis not present

## 2024-04-29 DIAGNOSIS — Z681 Body mass index (BMI) 19 or less, adult: Secondary | ICD-10-CM | POA: Diagnosis not present

## 2024-04-29 DIAGNOSIS — I2510 Atherosclerotic heart disease of native coronary artery without angina pectoris: Secondary | ICD-10-CM | POA: Diagnosis not present

## 2024-05-08 ENCOUNTER — Other Ambulatory Visit: Payer: Self-pay | Admitting: Cardiology

## 2024-05-11 DIAGNOSIS — R339 Retention of urine, unspecified: Secondary | ICD-10-CM | POA: Diagnosis not present

## 2024-05-11 DIAGNOSIS — Z466 Encounter for fitting and adjustment of urinary device: Secondary | ICD-10-CM | POA: Diagnosis not present

## 2024-06-08 DIAGNOSIS — R339 Retention of urine, unspecified: Secondary | ICD-10-CM | POA: Diagnosis not present

## 2024-06-08 DIAGNOSIS — Z466 Encounter for fitting and adjustment of urinary device: Secondary | ICD-10-CM | POA: Diagnosis not present

## 2024-06-11 ENCOUNTER — Other Ambulatory Visit: Payer: Self-pay | Admitting: Cardiology

## 2024-07-08 DIAGNOSIS — Z466 Encounter for fitting and adjustment of urinary device: Secondary | ICD-10-CM | POA: Diagnosis not present

## 2024-07-08 DIAGNOSIS — R339 Retention of urine, unspecified: Secondary | ICD-10-CM | POA: Diagnosis not present

## 2024-07-09 ENCOUNTER — Other Ambulatory Visit: Payer: Self-pay

## 2024-07-09 MED ORDER — METOPROLOL SUCCINATE ER 25 MG PO TB24: 25.0000 mg | ORAL_TABLET | Freq: Every day | ORAL | 0 refills | Status: DC

## 2024-07-18 ENCOUNTER — Other Ambulatory Visit: Payer: Self-pay | Admitting: Cardiology

## 2024-07-27 ENCOUNTER — Telehealth: Payer: Self-pay

## 2024-07-27 MED ORDER — METOPROLOL SUCCINATE ER 25 MG PO TB24: 25.0000 mg | ORAL_TABLET | Freq: Every day | ORAL | 0 refills | Status: DC

## 2024-07-27 NOTE — Telephone Encounter (Signed)
 Pt scheduled for 08/13/24

## 2024-07-28 MED ORDER — METOPROLOL SUCCINATE ER 25 MG PO TB24: 25.0000 mg | ORAL_TABLET | Freq: Every day | ORAL | 0 refills | Status: DC

## 2024-07-28 NOTE — Telephone Encounter (Signed)
 Pt scheduled 08/13/24, sent in another 15 days supply.

## 2024-07-28 NOTE — Addendum Note (Signed)
 Addended by: MEMORY DELON POUR on: 07/28/2024 07:34 AM   Modules accepted: Orders

## 2024-08-13 ENCOUNTER — Ambulatory Visit: Attending: Cardiology | Admitting: Cardiology

## 2024-08-13 ENCOUNTER — Other Ambulatory Visit: Payer: Self-pay | Admitting: Cardiology

## 2024-08-13 ENCOUNTER — Encounter: Payer: Self-pay | Admitting: Cardiology

## 2024-08-13 VITALS — BP 120/62 | HR 92 | Ht 72.0 in | Wt 138.0 lb

## 2024-08-13 DIAGNOSIS — Z951 Presence of aortocoronary bypass graft: Secondary | ICD-10-CM | POA: Diagnosis present

## 2024-08-13 DIAGNOSIS — E1100 Type 2 diabetes mellitus with hyperosmolarity without nonketotic hyperglycemic-hyperosmolar coma (NKHHC): Secondary | ICD-10-CM | POA: Insufficient documentation

## 2024-08-13 DIAGNOSIS — I2510 Atherosclerotic heart disease of native coronary artery without angina pectoris: Secondary | ICD-10-CM | POA: Diagnosis present

## 2024-08-13 DIAGNOSIS — F17210 Nicotine dependence, cigarettes, uncomplicated: Secondary | ICD-10-CM | POA: Diagnosis not present

## 2024-08-13 DIAGNOSIS — Z9889 Other specified postprocedural states: Secondary | ICD-10-CM | POA: Insufficient documentation

## 2024-08-13 MED ORDER — ATORVASTATIN CALCIUM 20 MG PO TABS: 20.0000 mg | ORAL_TABLET | Freq: Every day | ORAL | 3 refills | Status: AC

## 2024-08-13 MED ORDER — METOPROLOL SUCCINATE ER 25 MG PO TB24: 25.0000 mg | ORAL_TABLET | Freq: Every day | ORAL | 3 refills | Status: AC

## 2024-08-13 NOTE — Progress Notes (Signed)
 " Cardiology Office Note:    Date:  08/13/2024   ID:  Joseph Robles, DOB Jul 26, 1951, MRN [id number]  PCP:  Joseph Glisson, MD  Cardiologist:  Joseph JONELLE Crape, MD   Referring MD: Joseph Glisson, MD    ASSESSMENT:    1. Coronary artery disease involving native coronary artery of native heart without angina pectoris   2. Type 2 diabetes mellitus with hyperosmolarity without coma, without long-term current use of insulin  (HCC)   3. Continuous dependence on cigarette smoking   4. S/P CABG x 2   5. S/P mitral valve repair    PLAN:    In order of problems listed above:  Coronary artery disease:Primary prevention stressed with the patient.  Importance of compliance with diet medication stressed and patient verbalized standing. Post mitral valve repair: Stable at this time.  Echo report reviewed with the patient at length and questions were answered to satisfaction. Cardiomyopathy: Patient is not very compliant with follow-up advice about therapy.  I did discuss guideline directed medical therapy.  His blood pressure is borderline.  He is not keen on this.  Will do an echocardiogram to assess the progress of valvular heart disease and cardiomyopathy. Mixed dyslipidemia: On lipid-lowering medications followed by primary care.  Lipids are not at goal.  I wanted to increase statins but he wants blood pressure checked by primary care.  Goal LDL should be less than 60. Diabetes mellitus: Diet emphasized.  Hemoglobin A1c is elevated. Cigarette smoker: I spent 5 minutes with the patient discussing solely about smoking. Smoking cessation was counseled. I suggested to the patient also different medications and pharmacological interventions. Patient is keen to try stopping on its own at this time. He will get back to me if he needs any further assistance in this matter. Patient will be seen in follow-up appointment in 6 months or earlier if the patient has any concerns.   Medication Adjustments/Labs  and Tests Ordered: Current medicines are reviewed at length with the patient today.  Concerns regarding medicines are outlined above.  Orders Placed This Encounter  Procedures   EKG 12-Lead   No orders of the defined types were placed in this encounter.    No chief complaint on file.    History of Present Illness:    Joseph Robles is a 74 y.o. male.  Patient has past medical history of coronary artery disease post CABG surgery, mitral valve repair, mixed dyslipidemia and diabetes mellitus.  Unfortunately he leads a sedentary lifestyle.  He tells me that he has a suprapubic catheter.  He continues to smoke.  At the time of my evaluation, the patient is alert awake oriented and in no distress.  He denies any chest pain orthopnea or PND.  Past Medical History:  Diagnosis Date   Anterior urethral stricture 12/11/2019   Bladder diverticulum 01/09/2020   Continuous dependence on cigarette smoking 08/23/2021   Coronary artery disease 05/03/2021   DM type 2 (diabetes mellitus, type 2) (HCC)    Hyperlipidemia    Incomplete emptying of bladder 01/09/2020   Lower urinary tract symptoms (LUTS) 10/20/2019   Nonrheumatic mitral valve regurgitation    Pre-operative cardiovascular examination 10/29/2022   Protein-calorie malnutrition, severe 05/02/2021   Retention of urine, unspecified 10/24/2022   S/P CABG x 2 05/03/2021   SVG to OM SVG to PDA   S/P mitral valve repair 05/03/2021   Size 32 mm Annuloplasty ring   Severe mitral regurgitation 04/26/2021   Stable angina pectoris 04/19/2023   Suprapubic  catheter (HCC) 03/29/2020   Syncope and collapse     Past Surgical History:  Procedure Laterality Date   CORONARY ARTERY BYPASS GRAFT N/A 05/03/2021   Procedure: CORONARY ARTERY BYPASS GRAFTING (CABG) TIMES TWO ON PUMP USING ENDOSCOPICALLY HARVESTED RIGHT GREATER SAPHENOUS VEIN;  Surgeon: Kerrin Elspeth BROCKS, MD;  Location: [facility] OR;  Service: Open Heart Surgery;  Laterality: N/A;   ENDOVEIN  HARVEST OF GREATER SAPHENOUS VEIN Right 05/03/2021   Procedure: ENDOVEIN HARVEST OF GREATER SAPHENOUS VEIN;  Surgeon: Kerrin Elspeth BROCKS, MD;  Location: [facility] OR;  Service: Open Heart Surgery;  Laterality: Right;   MITRAL VALVE REPAIR N/A 05/03/2021   Procedure: MITRAL VALVE REPAIR USING MEDTRONIC SIMUFORM ANNULOPLASTY RING (MVR);  Surgeon: Kerrin Elspeth BROCKS, MD;  Location: [facility] OR;  Service: Open Heart Surgery;  Laterality: N/A;   RIGHT/LEFT HEART CATH AND CORONARY ANGIOGRAPHY N/A 04/26/2021   Procedure: RIGHT/LEFT HEART CATH AND CORONARY ANGIOGRAPHY;  Surgeon: Verlin Lonni BIRCH, MD;  Location: [facility] INVASIVE CV LAB;  Service: Cardiovascular;  Laterality: N/A;   TEE WITHOUT CARDIOVERSION N/A 04/28/2021   Procedure: TRANSESOPHAGEAL ECHOCARDIOGRAM (TEE);  Surgeon: Okey Vina GAILS, MD;  Location: [facility] ENDOSCOPY;  Service: Cardiovascular;  Laterality: N/A;   TEE WITHOUT CARDIOVERSION N/A 05/03/2021   Procedure: TRANSESOPHAGEAL ECHOCARDIOGRAM (TEE);  Surgeon: Kerrin Elspeth BROCKS, MD;  Location: [facility] OR;  Service: Open Heart Surgery;  Laterality: N/A;   URETERAL STENT PLACEMENT      Current Medications: Active Medications[1]   Allergies:   Neosporin [neomycin-bacitracin zn-polymyx]   Social History   Socioeconomic History   Marital status: Married    Spouse name: Not on file   Number of children: Not on file   Years of education: Not on file   Highest education level: Not on file  Occupational History   Not on file  Tobacco Use   Smoking status: Every Day    Current packs/day: 0.50    Types: Cigarettes   Smokeless tobacco: Never  Substance and Sexual Activity   Alcohol  use: Yes    Comment: social drinker   Drug use: No   Sexual activity: Not on file  Other Topics Concern   Not on file  Social History Narrative   Not on file   Social Drivers of Health   Tobacco Use: High Risk (08/13/2024)   Patient History    Smoking Tobacco Use: Every Day    Smokeless Tobacco Use: Never     Passive Exposure: Not on file  Financial Resource Strain: Not on file  Food Insecurity: Not on file  Transportation Needs: Not on file  Physical Activity: Not on file  Stress: Not on file  Social Connections: Not on file  Depression (EYV7-0): Not on file  Alcohol  Screen: Not on file  Housing: Not on file  Utilities: Not on file  Health Literacy: Not on file     Family History: The patient's family history includes Hyperlipidemia in his father. There is no history of Hypertension, Heart disease, Diabetes, or Cancer.  ROS:   Please see the history of present illness.    All other systems reviewed and are negative.  EKGs/Labs/Other Studies Reviewed:    The following studies were reviewed today: .SABRAEKG Interpretation Date/Time:  Thursday August 13 2024 16:05:21 EST Ventricular Rate:  92 PR Interval:  124 QRS Duration:  96 QT Interval:  360 QTC Calculation: 445 R Axis:   2  Text Interpretation: Normal sinus rhythm Possible Inferior infarct , age undetermined Abnormal ECG When compared with ECG  of 04-May-2021 06:52, Vent. rate has increased BY  35 BPM Right bundle branch block is no longer Present Borderline criteria for Inferior infarct are now Present Confirmed by Edwyna Backers [phone]) on 08/13/2024 4:20:29 PM     Recent Labs: No results found for requested labs within last 365 days.  Recent Lipid Panel    Component Value Date/Time   CHOL 143 04/27/2021 0224   TRIG 57 04/27/2021 0224   HDL 47 04/27/2021 0224   CHOLHDL 3.0 04/27/2021 0224   VLDL 11 04/27/2021 0224   LDLCALC 85 04/27/2021 0224    Physical Exam:    VS:  BP 120/62   Pulse 92   Ht 6' (1.829 m)   Wt 138 lb (62.6 kg)   SpO2 95%   BMI 18.72 kg/m     Wt Readings from Last 3 Encounters:  08/13/24 138 lb (62.6 kg)  04/19/23 137 lb 12.8 oz (62.5 kg)  11/01/22 142 lb (64.4 kg)     GEN: Patient is in no acute distress HEENT: Normal NECK: No JVD; No carotid bruits LYMPHATICS: No  lymphadenopathy CARDIAC: Hear sounds regular, 2/6 systolic murmur at the apex. RESPIRATORY:  Clear to auscultation without rales, wheezing or rhonchi  ABDOMEN: Soft, non-tender, non-distended MUSCULOSKELETAL:  No edema; No deformity  SKIN: Warm and dry NEUROLOGIC:  Alert and oriented x 3 PSYCHIATRIC:  Normal affect   Signed, Backers JONELLE Edwyna, MD  08/13/2024 4:28 PM    [facility]     [1]  Current Meds  Medication Sig   aspirin  EC 81 MG tablet Take 1 tablet (81 mg total) by mouth daily. Swallow whole.   atorvastatin  (LIPITOR ) 20 MG tablet Take 1 tablet (20 mg total) by mouth daily.   Cholecalciferol  25 MCG (1000 UT) tablet Take 1,000 Units by mouth daily.   cyanocobalamin  100 MCG tablet Take 100 mcg by mouth daily.   ferrous gluconate  (FERGON) 324 MG tablet Take 1 tablet (324 mg total) by mouth 2 (two) times daily with a meal. (Patient taking differently: Take 324 mg by mouth daily.)   JARDIANCE 25 MG TABS tablet Take 25 mg by mouth daily.   metFORMIN  (GLUCOPHAGE ) 1000 MG tablet Take 1,000 mg by mouth 2 (two) times daily.   metoprolol  succinate (TOPROL -XL) 25 MG 24 hr tablet Take 1 tablet (25 mg total) by mouth daily. Patient needs an appointment for further refills. 3rd attempt.   oxybutynin  (DITROPAN ) 5 MG tablet Take 5 mg by mouth daily as needed for bladder spasms.   "

## 2024-08-13 NOTE — Patient Instructions (Addendum)
 Medication Instructions:  Your physician recommends that you continue on your current medications as directed. Please refer to the Current Medication list given to you today.  *If you need a refill on your cardiac medications before your next appointment, please call your pharmacy*  Lab Work: None If you have labs (blood work) drawn today and your tests are completely normal, you will receive your results only by: MyChart Message (if you have MyChart) OR A paper copy in the mail If you have any lab test that is abnormal or we need to change your treatment, we will call you to review the results.  Testing/Procedures: Echocardiogram Your physician has requested that you have an echocardiogram. Echocardiography is a painless test that uses sound waves to create images of your heart. It provides your doctor with information about the size and shape of your heart and how well your hearts chambers and valves are working. This procedure takes approximately one hour. There are no restrictions for this procedure. Please do NOT wear cologne, perfume, aftershave, or lotions (deodorant is allowed). Please arrive 15 minutes prior to your appointment time.  Please note: We ask at that you not bring children with you during ultrasound (echo/ vascular) testing. Due to room size and safety concerns, children are not allowed in the ultrasound rooms during exams. Our front office staff cannot provide observation of children in our lobby area while testing is being conducted. An adult accompanying a patient to their appointment will only be allowed in the ultrasound room at the discretion of the ultrasound technician under special circumstances. We apologize for any inconvenience.    Follow-Up: At [facility], you and your health needs are our priority.  As part of our continuing mission to provide you with exceptional heart care, our providers are all part of one team.  This team includes your primary  Cardiologist (physician) and Advanced Practice Providers or APPs (Physician Assistants and Nurse Practitioners) who all work together to provide you with the care you need, when you need it.  Your next appointment:   9 month(s)  Provider:   Jennifer Crape, MD    We recommend signing up for the patient portal called MyChart.  Sign up information is provided on this After Visit Summary.  MyChart is used to connect with patients for Virtual Visits (Telemedicine).  Patients are able to view lab/test results, encounter notes, upcoming appointments, etc.  Non-urgent messages can be sent to your provider as well.   To learn more about what you can do with MyChart, go to [email or web address].   Other Instructions

## 2024-09-09 ENCOUNTER — Ambulatory Visit: Payer: Self-pay | Admitting: Cardiology

## 2024-09-09 ENCOUNTER — Ambulatory Visit

## 2024-09-09 DIAGNOSIS — F17210 Nicotine dependence, cigarettes, uncomplicated: Secondary | ICD-10-CM

## 2024-09-09 DIAGNOSIS — Z951 Presence of aortocoronary bypass graft: Secondary | ICD-10-CM | POA: Diagnosis not present

## 2024-09-09 DIAGNOSIS — Z9889 Other specified postprocedural states: Secondary | ICD-10-CM

## 2024-09-09 DIAGNOSIS — E1100 Type 2 diabetes mellitus with hyperosmolarity without nonketotic hyperglycemic-hyperosmolar coma (NKHHC): Secondary | ICD-10-CM

## 2024-09-09 DIAGNOSIS — I2510 Atherosclerotic heart disease of native coronary artery without angina pectoris: Secondary | ICD-10-CM

## 2024-09-09 LAB — ECHOCARDIOGRAM COMPLETE
AR max vel: 2.55 cm2
AV Area VTI: 2.74 cm2
AV Area mean vel: 2.5 cm2
AV Mean grad: 2 mmHg
AV Peak grad: 3.9 mmHg
Ao pk vel: 0.98 m/s
Area-P 1/2: 2.8 cm2
MV M vel: 3.62 m/s
MV Peak grad: 52.4 mmHg
MV VTI: 1.05 cm2
S' Lateral: 3.5 cm
